# Patient Record
Sex: Female | Born: 1950 | Race: Black or African American | Hispanic: No | Smoking: Current every day smoker
Health system: Southern US, Community
[De-identification: ages and names within clinical notes are randomized; demographics above are authoritative.]

## PROBLEM LIST (undated history)

## (undated) DIAGNOSIS — R45851 Suicidal ideations: Secondary | ICD-10-CM

## (undated) DIAGNOSIS — N390 Urinary tract infection, site not specified: Secondary | ICD-10-CM

## (undated) DIAGNOSIS — F32A Depression, unspecified: Secondary | ICD-10-CM

## (undated) DIAGNOSIS — N189 Chronic kidney disease, unspecified: Secondary | ICD-10-CM

## (undated) DIAGNOSIS — E785 Hyperlipidemia, unspecified: Secondary | ICD-10-CM

## (undated) DIAGNOSIS — F329 Major depressive disorder, single episode, unspecified: Secondary | ICD-10-CM

## (undated) HISTORY — PX: ABDOMINAL HYSTERECTOMY: SHX81

## (undated) HISTORY — DX: Chronic kidney disease, unspecified: N18.9

## (undated) HISTORY — PX: BREAST CYST EXCISION: SHX579

## (undated) HISTORY — DX: Hyperlipidemia, unspecified: E78.5

## (undated) HISTORY — DX: Urinary tract infection, site not specified: N39.0

---

## 2015-03-16 DIAGNOSIS — I63239 Cerebral infarction due to unspecified occlusion or stenosis of unspecified carotid arteries: Secondary | ICD-10-CM | POA: Insufficient documentation

## 2015-10-15 DIAGNOSIS — I639 Cerebral infarction, unspecified: Secondary | ICD-10-CM | POA: Insufficient documentation

## 2016-04-26 DIAGNOSIS — H903 Sensorineural hearing loss, bilateral: Secondary | ICD-10-CM | POA: Insufficient documentation

## 2016-04-30 DIAGNOSIS — F17210 Nicotine dependence, cigarettes, uncomplicated: Secondary | ICD-10-CM | POA: Insufficient documentation

## 2016-09-19 DIAGNOSIS — R911 Solitary pulmonary nodule: Secondary | ICD-10-CM | POA: Insufficient documentation

## 2016-10-12 ENCOUNTER — Encounter (HOSPITAL_COMMUNITY): Payer: Self-pay

## 2016-10-12 ENCOUNTER — Emergency Department (HOSPITAL_COMMUNITY): Payer: Self-pay

## 2016-10-12 ENCOUNTER — Emergency Department (HOSPITAL_COMMUNITY)
Admission: EM | Admit: 2016-10-12 | Discharge: 2016-10-12 | Disposition: A | Discharge status: Home / Self Care | Payer: Self-pay | Attending: Emergency Medicine | Admitting: Emergency Medicine

## 2016-10-12 DIAGNOSIS — R1032 Left lower quadrant pain: Secondary | ICD-10-CM | POA: Insufficient documentation

## 2016-10-12 DIAGNOSIS — F17200 Nicotine dependence, unspecified, uncomplicated: Secondary | ICD-10-CM | POA: Insufficient documentation

## 2016-10-12 DIAGNOSIS — M25552 Pain in left hip: Secondary | ICD-10-CM | POA: Insufficient documentation

## 2016-10-12 LAB — URINALYSIS, ROUTINE W REFLEX MICROSCOPIC
Bacteria, UA: NONE SEEN
Bilirubin Urine: NEGATIVE
Glucose, UA: NEGATIVE mg/dL
Ketones, ur: NEGATIVE mg/dL
Leukocytes, UA: NEGATIVE
NITRITE: NEGATIVE
PROTEIN: NEGATIVE mg/dL
SPECIFIC GRAVITY, URINE: 1.015 (ref 1.005–1.030)
pH: 5 (ref 5.0–8.0)

## 2016-10-12 LAB — COMPREHENSIVE METABOLIC PANEL
ALBUMIN: 4 g/dL (ref 3.5–5.0)
ALK PHOS: 65 U/L (ref 38–126)
ALT: 12 U/L — AB (ref 14–54)
AST: 24 U/L (ref 15–41)
Anion gap: 8 (ref 5–15)
BILIRUBIN TOTAL: 0.5 mg/dL (ref 0.3–1.2)
BUN: 13 mg/dL (ref 6–20)
CO2: 24 mmol/L (ref 22–32)
CREATININE: 1.2 mg/dL — AB (ref 0.44–1.00)
Calcium: 9.5 mg/dL (ref 8.9–10.3)
Chloride: 109 mmol/L (ref 101–111)
GFR calc Af Amer: 54 mL/min — ABNORMAL LOW (ref >60)
GFR calc non Af Amer: 47 mL/min — ABNORMAL LOW (ref >60)
GLUCOSE: 83 mg/dL (ref 65–99)
Potassium: 4.8 mmol/L (ref 3.5–5.1)
Sodium: 141 mmol/L (ref 135–145)
TOTAL PROTEIN: 7.2 g/dL (ref 6.5–8.1)

## 2016-10-12 LAB — CBC
HEMATOCRIT: 41.6 % (ref 36.0–46.0)
HEMOGLOBIN: 13.7 g/dL (ref 12.0–15.0)
MCH: 33.1 pg (ref 26.0–34.0)
MCHC: 32.9 g/dL (ref 30.0–36.0)
MCV: 100.5 fL — AB (ref 78.0–100.0)
Platelets: 247 10*3/uL (ref 150–400)
RBC: 4.14 MIL/uL (ref 3.87–5.11)
RDW: 13.7 % (ref 11.5–15.5)
WBC: 7.3 10*3/uL (ref 4.0–10.5)

## 2016-10-12 MED ORDER — ACETAMINOPHEN 325 MG PO TABS
650.0000 mg | ORAL_TABLET | Freq: Once | ORAL | Status: AC
  Administered 2016-10-12: 650 mg via ORAL
  Filled 2016-10-12: qty 2

## 2016-10-12 MED ORDER — CYCLOBENZAPRINE HCL 5 MG PO TABS: 5.0000 mg | ORAL_TABLET | Freq: Two times a day (BID) | ORAL | 0 refills | Status: DC | PRN

## 2016-10-12 NOTE — ED Provider Notes (Signed)
MSE was initiated and I personally evaluated the patient and placed orders (if any) at  11:15 AM on Oct 12, 2016.  The patient appears stable so that the remainder of the MSE may be completed by another provider.  Susan Hawkins is a 66 y.o. female who presents to the Emergency Department complaining of constant, progressively worsening left flank pain that began one week ago. She states the pain has increased over the past three days. She reports the pain radiates to the left inguinal region. She also reports some hematuria and reports having nonobstructive kidney stones diagnosed about one month ago. She rates the pain at 9/10. She states at first the pain was intermittent and now is constant. She has taken Tylenol and Aleve, has been applying heat and ice therapy and wearing her back brace all with minimal relief. Walking increases the pain. She denies alleviating factors. She denies fever, chills, nausea, vomiting, numbness, tingling or weakness of any extremity, bowel or bladder incontinence, dysuria, urinary increase or urgency. She states she has had a cyst removed from the L4-L5 region several years ago. She also states she also has other cysts on her vertebrae.  She denies h/o cancer or IV drug use. She denies any known trauma, injury or fall.   Patient is afebrile, hemodynamically stable, in no apparent distress. Abdomen soft with no tenderness to palpation. Mild tenderness to palpation to left inguinal area. Patient can reproduce the left flank pain radiating to her left inguinal area pain while standing and walking with movement. She also complained of hematuria. She states she was diagnosed with kidney stones last month in [place] by ultrasound. However there is no documentation on Epic regarding any visits to [facility] [place] [facility] [place] [facility]. She is neurologically intact. Coordination intact. Axo x 3. Able to stand and ambulate without difficulty. Patient had tenderness midline to  lower thoracic and upper lumbar spine. She states this is incidental and not related to today's visit.    Flonnie Overman [place], [place] 10/12/16 [place], [place], [place] 10/12/16 1115    Julianne Rice, MD 10/16/16 1526

## 2016-10-12 NOTE — ED Notes (Signed)
Pt removed cords and got dressed on her on. Pt sitting with family and awaiting discharge paperwork.

## 2016-10-12 NOTE — ED Notes (Signed)
Pt to US.

## 2016-10-12 NOTE — Discharge Instructions (Signed)
Flexeril is a muscle relaxer that you were given a prescription for today.  If you take it please do not drive, operate heavy machinery or participate in other activities that may be risky to your self or others for at least 24 hours from your last dose.

## 2016-10-12 NOTE — ED Notes (Signed)
Pt stated she had blood in her urine.

## 2016-10-12 NOTE — ED Notes (Signed)
Patient ambulated to room holding back.  Upon arrival to room Patient states "Why am I here", Explained to patient she will be seen by provider in this room.

## 2016-10-12 NOTE — ED Triage Notes (Signed)
Pt. Has lt. Lumbar pain. Pt. Denies any injury.  Pain increases with movement.  Pt. Denies any kidney problems or vaginal issues

## 2016-10-12 NOTE — ED Provider Notes (Signed)
[facility] DEPT Provider Note   CSN: [id number] Arrival date & time: 10/12/16  1017     History   Chief Complaint Chief Complaint  Patient presents with  . Back Pain  . Flank Pain    HPI Susan Hawkins is a 66 y.o. female.  HPI She presents with worsening left flank pain that radiates into her left groin.  Her pain has been present for about a week but has been gradually worsening over the past 3 days.  Her pain increases with walking.  She has not found any alleviating factors, has been taking tylenol, aleve, and attempting heat and ice therapy.  She reports blood in her urine and a history of kidney stones but has never passed a stone before.  She denies nausea, vomiting, had one loose BM this morning, no abdominal pain.  No fevers, chills, changes to bowel or bladder function, numbness or tingling that is new for her.  She has a history of a cyst removal from her lower lumbar vertebra but reports that was years ago and wonders if it is coming back.    History reviewed. No pertinent past medical history.  There are no active problems to display for this patient.   History reviewed. No pertinent surgical history.  OB History    No data available       Home Medications    Prior to Admission medications   Medication Sig Start Date End Date Taking? Authorizing Provider  cyclobenzaprine (FLEXERIL) 5 MG tablet Take 1 tablet (5 mg total) by mouth 2 (two) times daily as needed for muscle spasms. 10/12/16   Lorin Glass, PA-C    Family History No family history on file.  Social History Social History  Substance Use Topics  . Smoking status: Current Every Day Smoker  . Smokeless tobacco: Never Used  . Alcohol use Yes     Comment: occasional     Allergies   Patient has no allergy information on record.   Review of Systems Review of Systems  Constitutional: Negative for activity change, appetite change, fatigue and fever.  HENT: Negative for congestion and  sore throat.   Eyes: Negative for visual disturbance.  Respiratory: Negative for cough, chest tightness and shortness of breath.   Cardiovascular: Negative for chest pain and leg swelling.  Gastrointestinal: Positive for diarrhea (One loose stool this morning). Negative for abdominal distention, abdominal pain, nausea and vomiting.  Genitourinary: Positive for flank pain and hematuria. Negative for decreased urine volume, difficulty urinating, dysuria, frequency, pelvic pain, urgency, vaginal bleeding and vaginal discharge.  Musculoskeletal: Positive for back pain (Normally has midline back pain). Negative for joint swelling, neck pain and neck stiffness.  Skin: Negative for color change and rash.  Neurological: Negative for tremors, syncope, weakness, numbness and headaches.     Physical Exam Updated Vital Signs BP 138/64   Pulse 62   Temp 97.6 F (36.4 C) (Oral)   Resp 18   Ht 5\' 6"  (1.676 m)   Wt 59.4 kg   SpO2 99%   BMI 21.14 kg/m   Physical Exam  Constitutional: She appears well-developed and well-nourished. No distress.  HENT:  Head: Normocephalic and atraumatic.  Eyes: Conjunctivae are normal. No scleral icterus.  Neck: Neck supple.  Cardiovascular: Normal rate, regular rhythm and normal heart sounds.   No murmur heard. Pulmonary/Chest: Effort normal and breath sounds normal. No stridor. No respiratory distress.  Abdominal: Soft. Bowel sounds are normal. She exhibits no distension and no mass. There  is no hepatosplenomegaly. There is no tenderness. There is CVA tenderness (Left sided). There is no guarding.  Musculoskeletal: She exhibits no edema.       Left hip: She exhibits no tenderness and no bony tenderness.  Pain with external rotation of left hip which is the same pain and makes it worse.  Other movements do not produce pain.   Neurological: She is alert. No sensory deficit. She exhibits normal muscle tone.  Skin: Skin is warm and dry.  Psychiatric: She has a  normal mood and affect.  Nursing note and vitals reviewed.    ED Treatments / Results  Labs (all labs ordered are listed, but only abnormal results are displayed) Labs Reviewed  URINALYSIS, ROUTINE W REFLEX MICROSCOPIC - Abnormal; Notable for the following:       Result Value   Hgb urine dipstick SMALL (*)    Squamous Epithelial / LPF 0-5 (*)    All other components within normal limits  CBC - Abnormal; Notable for the following:    MCV 100.5 (*)    All other components within normal limits  COMPREHENSIVE METABOLIC PANEL - Abnormal; Notable for the following:    Creatinine, Ser 1.20 (*)    ALT 12 (*)    GFR calc non Af Amer 47 (*)    GFR calc Af Amer 54 (*)    All other components within normal limits    EKG  EKG Interpretation None       Radiology Dg Thoracic Spine 2 View  Result Date: 10/12/2016 CLINICAL DATA:  Back pain.  No known injury. EXAM: THORACIC SPINE 2 VIEWS COMPARISON:  None. FINDINGS: There is no evidence of thoracic spine fracture. Alignment is normal. No other significant bone abnormalities are identified. IMPRESSION: Negative. Electronically Signed   By: Rolm Baptise M.D.   On: 10/12/2016 11:35   Dg Lumbar Spine Complete  Result Date: 10/12/2016 CLINICAL DATA:  Back pain.  No reported injury. EXAM: LUMBAR SPINE - COMPLETE 4+ VIEW COMPARISON:  None. FINDINGS: This report assumes 5 non rib-bearing lumbar vertebrae. Lumbar vertebral body heights are preserved, with no fracture. Mild degenerative disc disease throughout the lumbar spine, most prominent at L2-3 and L4-5. There is 2 mm retrolisthesis at L2-3 and L3-4 and 4 mm anterolisthesis at L4-5. Mild bilateral facet arthropathy in the lower lumbar spine. No aggressive appearing focal osseous lesions. Abdominal aortic atherosclerosis. IMPRESSION: 1. Mild multilevel degenerative disc disease and spondylolisthesis in the lumbar spine . 2. Mild facet arthropathy in the lower lumbar spine. 3. Aortic atherosclerosis.  Electronically Signed   By: Ilona Sorrel M.D.   On: 10/12/2016 11:38   US Renal  Result Date: 10/12/2016 CLINICAL DATA:  Left flank pain. EXAM: RENAL / URINARY TRACT ULTRASOUND COMPLETE COMPARISON:  None. FINDINGS: Right Kidney: Length: 11 cm. 4.6 cm simple cyst is seen in lower pole. Echogenicity within normal limits. No mass or hydronephrosis visualized. Left Kidney: Length: 11 cm. Multiple cysts are noted with the largest measuring 2.9 cm. Echogenicity within normal limits. No mass or hydronephrosis visualized. Bladder: Appears normal for degree of bladder distention. IMPRESSION: Bilateral simple renal cysts.  No other renal abnormality seen. Electronically Signed   By: Marijo Conception, M.D.   On: 10/12/2016 12:48    Procedures Procedures (including critical care time)  Medications Ordered in ED Medications  acetaminophen (TYLENOL) tablet 650 mg (650 mg Oral Given 10/12/16 1409)     Initial Impression / Assessment and Plan / ED Course  I  have reviewed the triage vital signs and the nursing notes.  Pertinent labs & imaging results that were available during my care of the patient were reviewed by me and considered in my medical decision making (see chart for details).  Clinical Course as of Oct 13 1443  Thu Oct 12, 2016  1306 Patient re-checked.  Reports symptoms unchanged.  Patient informed of tube system malfunction causing delay in lab work, she voiced her understanding, no needs at this time.   [EH]  1436 Patient checked, given results,states is ready to go home.   [EH]    Clinical Course User Index [EH] Lorin Glass, PA-C    Avel Sensor presents with left buttock/flank pain that radiates around to her groin.  Given her history of kidney stones and "small" hematuria Ultrasound was performed to evaluate her kidneys.  No acute obstruction, known history of renal cysts.  Labs showed normal baseline labs for patient when compared to chart review.  Pain with hip external  rotation consistent with arthritis.  Based on creatine patient was treated with tylenol in the ED which controlled her pain.  She was given RX for flexeril.     At this time there does not appear to be any evidence of an acute emergency medical condition and the patient appears stable for discharge with appropriate outpatient follow up. Diagnosis was discussed with patient who verbalizes understanding and is agreeable to discharge. Pt case discussed with Dr. Darl Householder who agrees with my plan.   Patient will be given rx for flexeril.  Patient was advised that this medication may make them sleepy and impaired.  Patient was instructed not to drive, operate heavy machinery, etc for 24 hours after taking the medication or longer if they feel tired or impaired.    Final Clinical Impressions(s) / ED Diagnoses   Final diagnoses:  Left hip pain    New Prescriptions New Prescriptions   CYCLOBENZAPRINE (FLEXERIL) 5 MG TABLET    Take 1 tablet (5 mg total) by mouth 2 (two) times daily as needed for muscle spasms.     Lorin Glass, PA-C 10/12/16 1529    Drenda Freeze, MD 10/12/16 [phone]

## 2017-09-12 ENCOUNTER — Encounter: Payer: Self-pay | Admitting: Family Medicine

## 2017-09-12 ENCOUNTER — Ambulatory Visit (INDEPENDENT_AMBULATORY_CARE_PROVIDER_SITE_OTHER): Payer: Medicare Other | Admitting: Family Medicine

## 2017-09-12 VITALS — BP 110/60 | HR 71 | Temp 97.7°F | Wt 126.5 lb

## 2017-09-12 DIAGNOSIS — E785 Hyperlipidemia, unspecified: Secondary | ICD-10-CM | POA: Diagnosis not present

## 2017-09-12 DIAGNOSIS — Z7689 Persons encountering health services in other specified circumstances: Secondary | ICD-10-CM | POA: Diagnosis not present

## 2017-09-12 DIAGNOSIS — F17210 Nicotine dependence, cigarettes, uncomplicated: Secondary | ICD-10-CM

## 2017-09-12 NOTE — Patient Instructions (Signed)
Coping with Quitting Smoking Quitting smoking is a physical and mental challenge. You will face cravings, withdrawal symptoms, and temptation. Before quitting, work with your health care provider to make a plan that can help you cope. Preparation can help you quit and keep you from giving in. How can I cope with cravings? Cravings usually last for 5-10 minutes. If you get through it, the craving will pass. Consider taking the following actions to help you cope with cravings:  Keep your mouth busy: ? Chew sugar-free gum. ? Suck on hard candies or a straw. ? Brush your teeth.  Keep your hands and body busy: ? Immediately change to a different activity when you feel a craving. ? Squeeze or play with a ball. ? Do an activity or a hobby, like making bead jewelry, practicing needlepoint, or working with wood. ? Mix up your normal routine. ? Take a short exercise break. Go for a quick walk or run up and down stairs. ? Spend time in public places where smoking is not allowed.  Focus on doing something kind or helpful for someone else.  Call a friend or family member to talk during a craving.  Join a support group.  Call a quit line, such as 1-800-QUIT-NOW.  Talk with your health care provider about medicines that might help you cope with cravings and make quitting easier for you.  How can I deal with withdrawal symptoms? Your body may experience negative effects as it tries to get used to not having nicotine in the system. These effects are called withdrawal symptoms. They may include:  Feeling hungrier than normal.  Trouble concentrating.  Irritability.  Trouble sleeping.  Feeling depressed.  Restlessness and agitation.  Craving a cigarette.  To manage withdrawal symptoms:  Avoid places, people, and activities that trigger your cravings.  Remember why you want to quit.  Get plenty of sleep.  Avoid coffee and other caffeinated drinks. These may worsen some of your  symptoms.  How can I handle social situations? Social situations can be difficult when you are quitting smoking, especially in the first few weeks. To manage this, you can:  Avoid parties, bars, and other social situations where people might be smoking.  Avoid alcohol.  Leave right away if you have the urge to smoke.  Explain to your family and friends that you are quitting smoking. Ask for understanding and support.  Plan activities with friends or family where smoking is not an option.  What are some ways I can cope with stress? Wanting to smoke may cause stress, and stress can make you want to smoke. Find ways to manage your stress. Relaxation techniques can help. For example:  Breathe slowly and deeply, in through your nose and out through your mouth.  Listen to soothing, relaxing music.  Talk with a family member or friend about your stress.  Light a candle.  Soak in a bath or take a shower.  Think about a peaceful place.  What are some ways I can prevent weight gain? Be aware that many people gain weight after they quit smoking. However, not everyone does. To keep from gaining weight, have a plan in place before you quit and stick to the plan after you quit. Your plan should include:  Having healthy snacks. When you have a craving, it may help to: ? Eat plain popcorn, crunchy carrots, celery, or other cut vegetables. ? Chew sugar-free gum.  Changing how you eat: ? Eat small portion sizes at meals. ?   Eat 4-6 small meals throughout the day instead of 1-2 large meals a day. ? Be mindful when you eat. Do not watch television or do other things that might distract you as you eat.  Exercising regularly: ? Make time to exercise each day. If you do not have time for a long workout, do short bouts of exercise for 5-10 minutes several times a day. ? Do some form of strengthening exercise, like weight lifting, and some form of aerobic exercise, like running or  swimming.  Drinking plenty of water or other low-calorie or no-calorie drinks. Drink 6-8 glasses of water daily, or as much as instructed by your health care provider.  Summary  Quitting smoking is a physical and mental challenge. You will face cravings, withdrawal symptoms, and temptation to smoke again. Preparation can help you as you go through these challenges.  You can cope with cravings by keeping your mouth busy (such as by chewing gum), keeping your body and hands busy, and making calls to family, friends, or a helpline for people who want to quit smoking.  You can cope with withdrawal symptoms by avoiding places where people smoke, avoiding drinks with caffeine, and getting plenty of rest.  Ask your health care provider about the different ways to prevent weight gain, avoid stress, and handle social situations. This information is not intended to replace advice given to you by your health care provider. Make sure you discuss any questions you have with your health care provider. Document Released: 05/19/2016 Document Revised: 05/19/2016 Document Reviewed: 05/19/2016 Elsevier Interactive Patient Education  2018 [employer]  

## 2017-09-12 NOTE — Progress Notes (Signed)
Patient presents to clinic today to establish care.  SUBJECTIVE: PMH: Patient is a 67 year old female with past medical history significant for cyst on kidneys, high cholesterol, and microscopic hematuria.  Patient was previously seen in [place].  HLD: -Patient states her cholesterols been elevated in the past -She is currently taking simvastatin 20 mg daily -Patient requesting cholesterol be checked at this visit. -Patient states she needs to get back in the habit of working out.  Nicotine dependence: -Patient endorses smoking 1 pack/day x 30 years. -Patient was thought about quitting but has not done so yet.  Microscopic hematuria: -Patient endorses history of this. -Patient states in the past is been evaluated but the workup has been negative.  Allergies: NKDA Shellfish-nausea  Past surgical history Right breast cyst removal Hysterectomy 2/2 fibroids  Social history: Patient is divorced.  She is a retired Glass blower/designer.  Patient recently moved from [place] which is in [place].  Patient has 2 adult children.  Patient endorses tobacco, alcohol, recreational drug use.  Family medical history: Mom-deceased, arthritis, DM, early death, MI, HLD Dad-deceased Sister-alive, CKD 5 on HD Sister-diabetes, depression Sister-diabetes Brother-liver transplant  Health Maintenance: Mammogram --2018    Past Medical History:  Diagnosis Date  . Chronic kidney disease   . Hyperlipidemia   . UTI (urinary tract infection)     Past Surgical History:  Procedure Laterality Date  . ABDOMINAL HYSTERECTOMY    . BREAST CYST EXCISION Right     Current Outpatient Medications on File Prior to Visit  Medication Sig Dispense Refill  . aspirin EC 81 MG tablet Take 81 mg by mouth daily.    . simvastatin (ZOCOR) 20 MG tablet Take by mouth.     No current facility-administered medications on file prior to visit.     Allergies  Allergen Reactions   . Shellfish Allergy     Other  . Iodine Nausea And Vomiting    No family history on file.  Social History   Socioeconomic History  . Marital status: Divorced    Spouse name: Not on file  . Number of children: Not on file  . Years of education: Not on file  . Highest education level: Not on file  Occupational History  . Not on file  Social Needs  . Financial resource strain: Not on file  . Food insecurity:    Worry: Not on file    Inability: Not on file  . Transportation needs:    Medical: Not on file    Non-medical: Not on file  Tobacco Use  . Smoking status: Current Every Day Smoker  . Smokeless tobacco: Never Used  Substance and Sexual Activity  . Alcohol use: Yes    Comment: occasional  . Drug use: No  . Sexual activity: Yes    Birth control/protection: None  Lifestyle  . Physical activity:    Days per week: Not on file    Minutes per session: Not on file  . Stress: Not on file  Relationships  . Social connections:    Talks on phone: Not on file    Gets together: Not on file    Attends religious service: Not on file    Active member of club or organization: Not on file    Attends meetings of clubs or organizations: Not on file    Relationship status: Not on file  . Intimate partner violence:    Fear of current or ex partner: Not on file  Emotionally abused: Not on file    Physically abused: Not on file    Forced sexual activity: Not on file  Other Topics Concern  . Not on file  Social History Narrative  . Not on file    ROS General: Denies fever, chills, night sweats, changes in weight, changes in appetite HEENT: Denies headaches, ear pain, changes in vision, rhinorrhea, sore throat CV: Denies CP, palpitations, SOB, orthopnea Pulm: Denies SOB, cough, wheezing GI: Denies abdominal pain, nausea, vomiting, diarrhea, constipation GU: Denies dysuria, hematuria, frequency, vaginal discharge Msk: Denies muscle cramps, joint pains Neuro: Denies  weakness, numbness, tingling Skin: Denies rashes, bruising Psych: Denies depression, anxiety, hallucinations  BP 110/60 (BP Location: Left Arm, Patient Position: Sitting, Cuff Size: Normal)   Pulse 71   Temp 97.7 F (36.5 C) (Oral)   Wt 126 lb 8 oz (57.4 kg)   SpO2 97%   BMI 20.42 kg/m   Physical Exam Gen. Pleasant, well developed, well-nourished, in NAD HEENT - /AT, PERRL, no scleral icterus, no nasal drainage, pharynx without erythema or exudate. Lungs: no use of accessory muscles, CTAB, no wheezes, rales or rhonchi Cardiovascular: RRR,  No r/g/m, no peripheral edema Abdomen: BS present, soft, nontender,nondistended Neuro:  A&Ox3, CN II-XII intact, normal gait Psych: normal affect, mood appropriate  No results found for this or any previous visit (from the past 2160 hour(s)).  Assessment/Plan: Hyperlipidemia, unspecified hyperlipidemia type  - Plan: Lipid panel  Encounter to establish care -We reviewed the PMH, PSH, FH, SH, Meds and Allergies. -We provided refills for any medications we will prescribe as needed. -We addressed current concerns per orders and patient instructions. -We have asked for records for pertinent exams, studies, vaccines and notes from previous providers. -We have advised patient to follow up per instructions below.  Nicotine dependence without complication -Smoking cessation counseling greater than 3 minutes, less than 10 minutes -Patient currently smoking 1 pack/day x 30 years -Patient is thought about quitting but has not committed to that idea. -Discussed cutting down. -Handout on various methods to help her quit including gum, patches, lozenges, medications. - Plan: CT CHEST LUNG CA SCREEN LOW DOSE W/O CM  Follow-up in the next few months, sooner if needed  Grier Mitts, MD

## 2017-10-03 ENCOUNTER — Encounter (HOSPITAL_COMMUNITY): Payer: Self-pay

## 2017-10-03 ENCOUNTER — Emergency Department (HOSPITAL_COMMUNITY)
Admission: EM | Admit: 2017-10-03 | Discharge: 2017-10-03 | Disposition: A | Discharge status: Home / Self Care | Payer: Medicare Other | Attending: Emergency Medicine | Admitting: Emergency Medicine

## 2017-10-03 ENCOUNTER — Other Ambulatory Visit: Payer: Self-pay

## 2017-10-03 DIAGNOSIS — F17210 Nicotine dependence, cigarettes, uncomplicated: Secondary | ICD-10-CM | POA: Diagnosis not present

## 2017-10-03 DIAGNOSIS — R45851 Suicidal ideations: Secondary | ICD-10-CM | POA: Diagnosis not present

## 2017-10-03 DIAGNOSIS — F331 Major depressive disorder, recurrent, moderate: Secondary | ICD-10-CM | POA: Insufficient documentation

## 2017-10-03 DIAGNOSIS — F339 Major depressive disorder, recurrent, unspecified: Secondary | ICD-10-CM

## 2017-10-03 DIAGNOSIS — Z7982 Long term (current) use of aspirin: Secondary | ICD-10-CM | POA: Diagnosis not present

## 2017-10-03 DIAGNOSIS — Z79899 Other long term (current) drug therapy: Secondary | ICD-10-CM | POA: Insufficient documentation

## 2017-10-03 DIAGNOSIS — N189 Chronic kidney disease, unspecified: Secondary | ICD-10-CM | POA: Insufficient documentation

## 2017-10-03 HISTORY — DX: Major depressive disorder, single episode, unspecified: F32.9

## 2017-10-03 HISTORY — DX: Depression, unspecified: F32.A

## 2017-10-03 HISTORY — DX: Suicidal ideations: R45.851

## 2017-10-03 LAB — RAPID URINE DRUG SCREEN, HOSP PERFORMED
AMPHETAMINES: NOT DETECTED
BARBITURATES: NOT DETECTED
Benzodiazepines: NOT DETECTED
Cocaine: NOT DETECTED
OPIATES: NOT DETECTED
TETRAHYDROCANNABINOL: POSITIVE — AB

## 2017-10-03 LAB — SALICYLATE LEVEL: Salicylate Lvl: <7 mg/dL (ref 2.8–30.0)

## 2017-10-03 LAB — COMPREHENSIVE METABOLIC PANEL
ALK PHOS: 74 U/L (ref 38–126)
ALT: 10 U/L — AB (ref 14–54)
AST: 23 U/L (ref 15–41)
Albumin: 4 g/dL (ref 3.5–5.0)
Anion gap: 10 (ref 5–15)
BUN: 10 mg/dL (ref 6–20)
CALCIUM: 9.6 mg/dL (ref 8.9–10.3)
CHLORIDE: 106 mmol/L (ref 101–111)
CO2: 25 mmol/L (ref 22–32)
CREATININE: 1.12 mg/dL — AB (ref 0.44–1.00)
GFR calc Af Amer: 58 mL/min — ABNORMAL LOW (ref >60)
GFR calc non Af Amer: 50 mL/min — ABNORMAL LOW (ref >60)
GLUCOSE: 91 mg/dL (ref 65–99)
Potassium: 4.7 mmol/L (ref 3.5–5.1)
SODIUM: 141 mmol/L (ref 135–145)
Total Bilirubin: 0.6 mg/dL (ref 0.3–1.2)
Total Protein: 7.7 g/dL (ref 6.5–8.1)

## 2017-10-03 LAB — ACETAMINOPHEN LEVEL: Acetaminophen (Tylenol), Serum: <10 ug/mL — ABNORMAL LOW (ref 10–30)

## 2017-10-03 LAB — CBC
HEMATOCRIT: 42 % (ref 36.0–46.0)
HEMOGLOBIN: 13.9 g/dL (ref 12.0–15.0)
MCH: 33.2 pg (ref 26.0–34.0)
MCHC: 33.1 g/dL (ref 30.0–36.0)
MCV: 100.2 fL — ABNORMAL HIGH (ref 78.0–100.0)
Platelets: 218 10*3/uL (ref 150–400)
RBC: 4.19 MIL/uL (ref 3.87–5.11)
RDW: 13.4 % (ref 11.5–15.5)
WBC: 7.6 10*3/uL (ref 4.0–10.5)

## 2017-10-03 LAB — ETHANOL: Alcohol, Ethyl (B): <10 mg/dL (ref <10)

## 2017-10-03 MED ORDER — ONDANSETRON HCL 4 MG PO TABS: 4.0000 mg | ORAL_TABLET | Freq: Three times a day (TID) | ORAL | Status: DC | PRN

## 2017-10-03 MED ORDER — ACETAMINOPHEN 325 MG PO TABS: 650.0000 mg | ORAL_TABLET | Freq: Four times a day (QID) | ORAL | Status: DC | PRN

## 2017-10-03 MED ORDER — ALUM & MAG HYDROXIDE-SIMETH 200-200-20 MG/5ML PO SUSP: 30.0000 mL | Freq: Four times a day (QID) | ORAL | Status: DC | PRN

## 2017-10-03 NOTE — ED Notes (Signed)
Pt fully dressed and signed inventory log for clothes, reviewed outpatient appointment and paperwork.  Pt states understanding, advised we are waiting for Dr. Leonette Monarch to see her with paperwork.

## 2017-10-03 NOTE — BH Assessment (Signed)
Tele Assessment Note   Patient Name: Susan Hawkins MRN: [id number] Referring Physician: Dr. Rex Kras Location of Patient: [facility] Location of Provider: [facility]  Susan Hawkins is an 67 y.o. female. Pt denies SI/HI and AVH. Pt states she recently moved from [place] to [place] to assist her sister who is addicted to drugs. Per Pt she is overwhelmed with life at this time and is experiencing depression. Per Pt she's had depression that was managed by medication but she has been off her medication for a year and half. Pt states she feels she needs someone to talk to and medication in order to feel like herself again. Pt reports a previous SI attempt in 2014. Pt states "I would never harm myself again I would seek help first. Pt reports being hospitalized in 2014 in [place]. Pt reports previous outpatient treatment but denies current outpatient services. Pt reports occasional marijuana use.   Shuvon, NP recommends D/C. An appointment with [facility] made for the Pt for Tuesday May 7th.   Diagnosis:  F33.1   Past Medical History:  Past Medical History:  Diagnosis Date  . Chronic kidney disease   . Depression   . Hyperlipidemia   . Suicidal ideation   . UTI (urinary tract infection)     Past Surgical History:  Procedure Laterality Date  . ABDOMINAL HYSTERECTOMY    . BREAST CYST EXCISION Right     Family History: History reviewed. No pertinent family history.  Social History:  reports that she has been smoking cigarettes.  She has been smoking about 0.50 packs per day. She has never used smokeless tobacco. She reports that she drinks alcohol. She reports that she does not use drugs.  Additional Social History:  Alcohol / Drug Use Pain Medications: please see mar Prescriptions: please see mar Over the Counter: please see mar History of alcohol / drug use?: Yes Longest period of sobriety (when/how long): unknown Substance #1 Name of  Substance 1: marijuana 1 - Age of First Use: unknown 1 - Amount (size/oz): unknown 1 - Frequency: occasional 1 - Duration: ongoing 1 - Last Use / Amount: unknown  CIWA: CIWA-Ar BP: (!) 167/74 Pulse Rate: 79 COWS:    Allergies:  Allergies  Allergen Reactions  . Shellfish Allergy     Other  . Iodine Nausea And Vomiting    Home Medications:  (Not in a hospital admission)  OB/GYN Status:  No LMP recorded. Patient has had a hysterectomy.  General Assessment Data Location of Assessment: [facility] ED TTS Assessment: In system Is this a Tele or Face-to-Face Assessment?: Tele Assessment Is this an Initial Assessment or a Re-assessment for this encounter?: Initial Assessment Marital status: Divorced [place] name: NA Is patient pregnant?: No Pregnancy Status: No Living Arrangements: Other relatives Can pt return to current living arrangement?: Yes Admission Status: Voluntary Is patient capable of signing voluntary admission?: Yes Referral Source: Self/Family/Friend Insurance type: [place]     Crisis Care Plan Living Arrangements: Other relatives Legal Guardian: Other:(self) Name of Psychiatrist: NA Name of Therapist: NA     Risk to self with the past 6 months Suicidal Ideation: No Has patient been a risk to self within the past 6 months prior to admission? : No Suicidal Intent: No Has patient had any suicidal intent within the past 6 months prior to admission? : No Is patient at risk for suicide?: No Suicidal Plan?: No Has patient had any suicidal plan within the past 6 months prior to  admission? : No Access to Means: No What has been your use of drugs/alcohol within the last 12 months?: NA Previous Attempts/Gestures: No How many times?: 0 Other Self Harm Risks: NA Triggers for Past Attempts: None known Intentional Self Injurious Behavior: None Family Suicide History: No Recent stressful life event(s): Other (Comment)(move) Persecutory voices/beliefs?: No Depression:  Yes Depression Symptoms: Insomnia, Tearfulness, Isolating, Fatigue Substance abuse history and/or treatment for substance abuse?: No Suicide prevention information given to non-admitted patients: Not applicable  Risk to Others within the past 6 months Homicidal Ideation: No Does patient have any lifetime risk of violence toward others beyond the six months prior to admission? : No Thoughts of Harm to Others: No Current Homicidal Intent: No Current Homicidal Plan: No Access to Homicidal Means: No Identified Victim: NA History of harm to others?: No Assessment of Violence: None Noted Violent Behavior Description: NA Does patient have access to weapons?: No Criminal Charges Pending?: No Does patient have a court date: No Is patient on probation?: No  Psychosis Hallucinations: None noted Delusions: None noted  Mental Status Report Appearance/Hygiene: Unremarkable Eye Contact: Fair Motor Activity: Freedom of movement Speech: Logical/coherent Level of Consciousness: Alert Mood: Depressed Affect: Depressed Anxiety Level: Moderate Thought Processes: Coherent, Relevant Judgement: Unimpaired Orientation: Person, Place, Situation, Time Obsessive Compulsive Thoughts/Behaviors: None  Cognitive Functioning Concentration: Normal Memory: Recent Intact, Remote Intact Is patient IDD: No Is patient DD?: No Insight: Fair Impulse Control: Fair Appetite: Fair Have you had any weight changes? : No Change Sleep: No Change Total Hours of Sleep: 8 Vegetative Symptoms: None  ADLScreening [facility] Assessment Services) Patient's cognitive ability adequate to safely complete daily activities?: Yes Patient able to express need for assistance with ADLs?: Yes Independently performs ADLs?: Yes (appropriate for developmental age)  Prior Inpatient Therapy Prior Inpatient Therapy: Yes Prior Therapy Dates: 2014 Prior Therapy Facilty/Provider(s): in [place] Reason for Treatment: depression  Prior  Outpatient Therapy Prior Outpatient Therapy: Yes Prior Therapy Dates: 2014 Prior Therapy Facilty/Provider(s): in [place] Reason for Treatment: depression Does patient have an ACCT team?: No Does patient have Intensive In-House Services?  : No Does patient have [facility] services? : No Does patient have P4[facility] services?: No  ADL Screening (condition at time of admission) Patient's cognitive ability adequate to safely complete daily activities?: Yes Is the patient deaf or have difficulty hearing?: No Does the patient have difficulty seeing, even when wearing glasses/contacts?: No Does the patient have difficulty concentrating, remembering, or making decisions?: No Patient able to express need for assistance with ADLs?: Yes Does the patient have difficulty dressing or bathing?: No Independently performs ADLs?: Yes (appropriate for developmental age)       Abuse/Neglect Assessment (Assessment to be complete while patient is alone) Abuse/Neglect Assessment Can Be Completed: Yes Physical Abuse: Denies Verbal Abuse: Denies Sexual Abuse: Denies Exploitation of patient/patient's resources: Denies     Regulatory affairs officer (For Healthcare) Does Patient Have a Medical Advance Directive?: No Would patient like information on creating a medical advance directive?: No - Patient declined    Additional Information 1:1 In Past 12 Months?: No CIRT Risk: No Elopement Risk: No Does patient have medical clearance?: Yes     Disposition:  Disposition Initial Assessment Completed for this Encounter: Yes Disposition of Patient: Discharge Patient refused recommended treatment: No Mode of transportation if patient is discharged?: Car  This service was provided via telemedicine using a 2-way, interactive audio and video technology.  Names of all persons participating in this telemedicine service and their role in this  encounter. Name: Earleen Newport Role: NP  Name:  Role:   Name:  Role:   Name:  Role:      Cyndia Bent 10/03/2017 4:26 PM

## 2017-10-03 NOTE — ED Provider Notes (Signed)
[name] EMERGENCY DEPARTMENT Provider Note   CSN: [id number] Arrival date & time: 10/03/17  1230     History   Chief Complaint Chief Complaint  Patient presents with  . Depression    HPI Susan Hawkins is a 67 y.o. female.  67 year old female with past medical history below including depression who presents with depression and SI.  The patient states that she has had problems with depression most of her adult life.  She was previously on Xanax and reports that she discontinued Xanax and chronic pain medications approximately 4 years ago.  She moved here from [place] in January and had hoped that life would "slowed down" a little bit but it has not worked out.  She currently shares an apartment with her sister who she reports has drug problems.  The patient spoke with her daughter this morning and states that her depression feelings became much worse.  She reports having suicidal thoughts with thinking about taking pills.  She reports previous suicide attempt of taking pills approximately 5 years ago resulting in hospitalization.  She reports occasional marijuana use but denies any other drug or alcohol use.  The history is provided by the patient.  Depression     Past Medical History:  Diagnosis Date  . Chronic kidney disease   . Depression   . Hyperlipidemia   . Suicidal ideation   . UTI (urinary tract infection)     There are no active problems to display for this patient.   Past Surgical History:  Procedure Laterality Date  . ABDOMINAL HYSTERECTOMY    . BREAST CYST EXCISION Right      OB History   None      Home Medications    Prior to Admission medications   Medication Sig Start Date End Date Taking? Authorizing Provider  aspirin EC 81 MG tablet Take 81 mg by mouth daily.    [provider]  simvastatin (ZOCOR) 20 MG tablet Take by mouth. 10/27/16   [provider]    Family History History  reviewed. No pertinent family history.  Social History Social History   Tobacco Use  . Smoking status: Current Every Day Smoker    Packs/day: 0.50    Types: Cigarettes  . Smokeless tobacco: Never Used  Substance Use Topics  . Alcohol use: Yes    Comment: occasional  . Drug use: No     Allergies   Shellfish allergy and Iodine   Review of Systems Review of Systems  Psychiatric/Behavioral: Positive for depression.   All other systems reviewed and are negative except that which was mentioned in HPI   Physical Exam Updated Vital Signs BP (!) 167/74 (BP Location: Right Arm)   Pulse 79   Temp 97.8 F (36.6 C) (Oral)   Resp 16   Ht 5\' 6"  (1.676 m)   Wt 57.2 kg (126 lb)   SpO2 98%   BMI 20.34 kg/m   Physical Exam  Constitutional: She is oriented to person, place, and time. She appears well-developed and well-nourished. No distress.  HENT:  Head: Normocephalic and atraumatic.  Eyes: Conjunctivae are normal.  Neck: Neck supple.  Cardiovascular: Normal rate, regular rhythm and normal heart sounds.  Pulmonary/Chest: Effort normal and breath sounds normal.  Neurological: She is alert and oriented to person, place, and time.  Skin: Skin is warm and dry.  Psychiatric: Thought content normal.  Tearful, anxious and depressed, good eye contact  Nursing note and vitals reviewed.  ED Treatments / Results  Labs (all labs ordered are listed, but only abnormal results are displayed) Labs Reviewed  COMPREHENSIVE METABOLIC PANEL - Abnormal; Notable for the following components:      Result Value   Creatinine, Ser 1.12 (*)    ALT 10 (*)    GFR calc non Af Amer 50 (*)    GFR calc Af Amer 58 (*)    All other components within normal limits  ACETAMINOPHEN LEVEL - Abnormal; Notable for the following components:   Acetaminophen (Tylenol), Serum <10 (*)    All other components within normal limits  CBC - Abnormal; Notable for the following components:   MCV 100.2 (*)    All  other components within normal limits  ETHANOL  SALICYLATE LEVEL  RAPID URINE DRUG SCREEN, HOSP PERFORMED    EKG None  Radiology No results found.  Procedures Procedures (including critical care time)  Medications Ordered in ED Medications  acetaminophen (TYLENOL) tablet 650 mg (has no administration in time range)  ondansetron (ZOFRAN) tablet 4 mg (has no administration in time range)  alum & mag hydroxide-simeth (MAALOX/MYLANTA) 200-200-20 MG/5ML suspension 30 mL (has no administration in time range)     Initial Impression / Assessment and Plan / ED Course  I have reviewed the triage vital signs and the nursing notes.  Pertinent labs that were available during my care of the patient were reviewed by me and considered in my medical decision making (see chart for details).     Pt tearful but calm on exam. Labwork reassuring. Pt is medically clear. I feel she would benefit from psych eval given her SI with h/o overdose. I have contacted TTS and patient's dispo will be determined by psychiatry team recommendations.  Final Clinical Impressions(s) / ED Diagnoses   Final diagnoses:  None    ED Discharge Orders    None       Little, Wenda Overland, MD 10/03/17 1501

## 2017-10-03 NOTE — ED Notes (Addendum)
Sitter arrived at bedside Primary RN inventorying belongings with pt at bedside

## 2017-10-03 NOTE — ED Notes (Signed)
Will be discharged, but waiting for outpatient appointment

## 2017-10-03 NOTE — Consult Note (Signed)
  Tele Assessment   Susan Hawkins, 67 y.o., female patient presented to [facility] with complaints of worsening depression.  Patient seen via telepsych by TTS and this provider; chart reviewed and consulted with Dr. Dwyane Dee on 10/03/17.  On evaluation Susan Hawkins reports that she came to the hospital because she was feeling overwhelmed taking care of her sister with substance abuse problem which led to her depression worsening.  Patient states that she is looking for outpatient services.  Patient denies suicidal/self-harm/homicidal ideation, psychosis, and paranoia.    During evaluation Susan Hawkins is alert/oriented x 4; calm/cooperative with pleasant affect.  She does not appear to be responding to internal/external stimuli or delusional thoughts.  Patient denies suicidal/self-harm/homicidal ideation, psychosis, and paranoia.  Patient answered question appropriately.  Recommendations:  Patient psychiatrically cleared; Referred to Intensive outpatient   Disposition: No evidence of imminent risk to self or others at present.   Patient does not meet criteria for psychiatric inpatient admission. Refer to IOP.  For detailed note see TTS tele assessment note  Shuvon B. Rankin, NP

## 2017-10-03 NOTE — ED Notes (Signed)
With TTS

## 2017-10-03 NOTE — ED Notes (Signed)
Pt stable, ambulatory, states understanding of discharge instructions 

## 2017-10-03 NOTE — ED Triage Notes (Signed)
Pt endorses depression and anxiety that began "while having a conversation with my daughter this morning" Pt tearful in triage. Pt has hx of SI and has had SI thoughts this morning with thoughts of taking pills. Pt states "I just want to sleep all the time" Pt cooperative.

## 2017-10-03 NOTE — ED Notes (Signed)
Spoke with Dr. Leonette [facility] to advise pt to be discharged

## 2017-10-03 NOTE — ED Notes (Signed)
Pt tearful with this RN.

## 2017-10-03 NOTE — ED Notes (Signed)
TTS called back so NP can talk to patient

## 2017-10-03 NOTE — ED Provider Notes (Signed)
Medically cleared and [facility] cleared for discharge.   Disposition: Discharge  Condition: Good  I have discussed the results, Dx and Tx plan with the patient who expressed understanding and agree(s) with the plan. Discharge instructions discussed at great length. The patient was given strict return precautions who verbalized understanding of the instructions. No further questions at time of discharge.      Fatima Blank, MD 10/03/17 1705

## 2017-10-09 ENCOUNTER — Other Ambulatory Visit (HOSPITAL_COMMUNITY): Payer: Medicare Other | Attending: Psychiatry | Admitting: Licensed Clinical Social Worker

## 2017-10-09 DIAGNOSIS — F331 Major depressive disorder, recurrent, moderate: Secondary | ICD-10-CM | POA: Diagnosis not present

## 2017-10-09 DIAGNOSIS — F339 Major depressive disorder, recurrent, unspecified: Secondary | ICD-10-CM | POA: Insufficient documentation

## 2017-10-10 ENCOUNTER — Encounter (HOSPITAL_COMMUNITY): Payer: Self-pay | Admitting: Psychiatry

## 2017-10-10 ENCOUNTER — Other Ambulatory Visit (HOSPITAL_COMMUNITY): Payer: Medicare Other | Admitting: Psychiatry

## 2017-10-10 DIAGNOSIS — F339 Major depressive disorder, recurrent, unspecified: Secondary | ICD-10-CM

## 2017-10-10 MED ORDER — SERTRALINE HCL 25 MG PO TABS: 25.0000 mg | ORAL_TABLET | Freq: Every day | ORAL | 0 refills | Status: DC
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
## 2017-10-11 ENCOUNTER — Other Ambulatory Visit (HOSPITAL_COMMUNITY): Payer: Medicare Other | Admitting: Psychiatry

## 2017-10-11 DIAGNOSIS — F339 Major depressive disorder, recurrent, unspecified: Secondary | ICD-10-CM | POA: Diagnosis not present

## 2017-10-11 NOTE — Psych (Signed)
Susan Hawkins is a 67 y.o. female patient referred by [facility] ED for intensive outpatient services. Pt reports increased depression symptoms since moving to [place] at the beginning of the year. Pt reports tearfulness, isolation, and worry. Pt reports no healthy local support and spending her days largely alone. Pt denies SI/HI. Pt is assessed for current symptoms and severity. Pt is recommended for IOP. Cln educated pt on group and recommendation. Pt states she is eager to engage in group and accepts start date for 10/10/17 at 9 am.      Lorin Glass, LCSW

## 2017-10-11 NOTE — Progress Notes (Signed)
    Daily Group Progress Note  Program: IOP  Group Time: 9:00-12:00  Participation Level: Active  Behavioral Response: Appropriate  Type of Therapy:  Group Therapy  Summary of Progress: Pt. Presented alert, engaged in the therapeutic process, talkative, appropriately tearful. Pt. Discussed her sadness related to emotional distance from her children and grandchildren. Pt. Discussed that she currently lives with her sister and her sister has diabetes and chemical dependence. Pt.' response to her sisters' conditions cause her to be very sad. Pt.Participated in discussions about the grounding series, cognitive modeling and the purposes of different emotions.    Nancie Neas, LPC

## 2017-10-12 ENCOUNTER — Other Ambulatory Visit (HOSPITAL_COMMUNITY): Payer: Medicare Other

## 2017-10-15 ENCOUNTER — Other Ambulatory Visit (HOSPITAL_COMMUNITY): Payer: Medicare Other | Admitting: Psychiatry

## 2017-10-15 DIAGNOSIS — F339 Major depressive disorder, recurrent, unspecified: Secondary | ICD-10-CM | POA: Diagnosis not present

## 2017-10-15 NOTE — Progress Notes (Signed)
    Daily Group Progress Note  Program: IOP  Group Time: 9:00-12:00  Participation Level: Active  Behavioral Response: Appropriate  Type of Therapy:  Group Therapy  Summary of Progress:  Pt. Presented as alert, talkative, and engaged in the group process. Pt. Discussed concerns about new medication that had caused dizziness and nerve pain. Pt. Participated in group discussion about developing healthy boundaries, developing consistent self-care, and the importance of assertive communication in relationships.    Nancie Neas, LPC

## 2017-10-16 ENCOUNTER — Other Ambulatory Visit (HOSPITAL_COMMUNITY): Payer: Medicare Other

## 2017-10-17 ENCOUNTER — Other Ambulatory Visit (HOSPITAL_COMMUNITY): Payer: Medicare Other | Admitting: Psychiatry

## 2017-10-17 DIAGNOSIS — F339 Major depressive disorder, recurrent, unspecified: Secondary | ICD-10-CM

## 2017-10-17 DIAGNOSIS — F331 Major depressive disorder, recurrent, moderate: Secondary | ICD-10-CM

## 2017-10-17 NOTE — Progress Notes (Signed)
BH MD/PA/NP IOP Progress Note  10/17/2017 1:54 PM Susan Hawkins  MRN:  [id number]   Patient seen in the intensive outpatient program.  Presents with concerns of anxiety, decreased appetite, lightheadedness and weight loss symptoms.  Reports she continues to loss weight which is concerning to her.Susan Hawkins  reports her symptoms started a few weeks prior to admission to IOP.  Susan Hawkins was initiated on Zoloft 25 mg p.o. daily x1 week ago. Reports she is taken the Zoloft 25 mg and tolerating medications well as she reports she has taken this medications in the past.  Denies depression or depressive symptoms. Denies nausea, dirreaha or vomiting with medications.  States she has experienced lightheadeness after is took her first dose however reports her symptoms has resolved. Of note patient report she smokes daily has she has trying to cut down. Patient report she is unsure of her last [facility] women's physical. NP encouraged patient to make a appointment  with a primary care provider.  Will consider medication adjustment after follow-up from PCP   History: Per assessment note Susan Hawkins per assessment note 67 year- old African-American female, present after behavioral health assessment for depression.  Reports recently she has been experiencing feelings of depression,  hopelessness, tearfulness, and lack of concentration.  Reports she always had struggles with depression and anxiety  since she was younger.  [name] reports a recent move from [place] to [place] to be closer to family.  However reports moving has not been as anticipated.  Reports she is currently living with her sister who struggles with substance abuse. (Cocaine) Reports feeling overwhelmed with her current living situation. Susan Hawkins  reports she feels her family is dysfunctional with unresolved issues states she feels like she is always the one caring for everyone else in her family    .    Visit Diagnosis: No diagnosis found.  Past  Psychiatric History:   Past Medical History:  Past Medical History:  Diagnosis Date  . Chronic kidney disease   . Depression   . Hyperlipidemia   . Suicidal ideation   . UTI (urinary tract infection)     Past Surgical History:  Procedure Laterality Date  . ABDOMINAL HYSTERECTOMY    . BREAST CYST EXCISION Right     Family Psychiatric History:   Family History:  Family History  Problem Relation Age of Onset  . Depression Maternal Grandmother     Social History:  Social History   Socioeconomic History  . Marital status: Divorced    Spouse name: Not on file  . Number of children: Not on file  . Years of education: Not on file  . Highest education level: Not on file  Occupational History  . Not on file  Social Needs  . Financial resource strain: Not on file  . Food insecurity:    Worry: Not on file    Inability: Not on file  . Transportation needs:    Medical: Not on file    Non-medical: Not on file  Tobacco Use  . Smoking status: Current Every Day Smoker    Packs/day: 1.00    Types: Cigarettes  . Smokeless tobacco: Never Used  Substance and Sexual Activity  . Alcohol use: Yes    Comment: occasional  . Drug use: No  . Sexual activity: Yes    Birth control/protection: None  Lifestyle  . Physical activity:    Days per week: Not on file    Minutes per session: Not on file  . Stress: Not  on file  Relationships  . Social connections:    Talks on phone: Not on file    Gets together: Not on file    Attends religious service: Not on file    Active member of club or organization: Not on file    Attends meetings of clubs or organizations: Not on file    Relationship status: Not on file  Other Topics Concern  . Not on file  Social History Narrative  . Not on file    Allergies:  Allergies  Allergen Reactions  . Shellfish Allergy     Other  . Iodine Nausea And Vomiting    Metabolic Disorder Labs: No results found for: HGBA1C, MPG No results found for:  PROLACTIN No results found for: CHOL, TRIG, HDL, CHOLHDL, VLDL, LDLCALC No results found for: TSH  Therapeutic Level Labs: No results found for: LITHIUM No results found for: VALPROATE No components found for:  CBMZ  Current Medications: Current Outpatient Medications  Medication Sig Dispense Refill  . aspirin EC 81 MG tablet Take 81 mg by mouth every other day.     . diphenhydrAMINE (BENADRYL) 25 mg capsule Take 25 mg by mouth as needed for allergies.    Marland Kitchen sertraline (ZOLOFT) 25 MG tablet Take 1 tablet (25 mg total) by mouth daily. 30 tablet 0   No current facility-administered medications for this visit.      Musculoskeletal: Strength & Muscle Tone: within normal limits Gait & Station: normal Patient leans: N/A  Psychiatric Specialty Exam: ROS  There were no vitals taken for this visit.There is no height or weight on file to calculate BMI.  General Appearance: Casual  Eye Contact:  Fair  Speech:  Clear and Coherent  Volume:  Normal  Mood:  Anxious and Depressed  Affect:  Appropriate  Thought Process:  Coherent  Orientation:  Full (Time, Place, and Person)  Thought Content: Hallucinations: None   Suicidal Thoughts:  No  Homicidal Thoughts:  No  Memory:  Immediate;   Fair Recent;   Fair Remote;   Fair  Judgement:  Fair  Insight:  Fair  Psychomotor Activity:  Normal  Concentration:  Concentration: Fair  Recall:  [employer] of Knowledge: Fair  Language: Fair  Akathisia:  No  Handed:  Right  AIMS (if indicated)  Assets:  Communication Skills Desire for Improvement Resilience Social Support  ADL's:  Intact  Cognition: WNL  Sleep:  Good   Screenings: PHQ2-9     Office Visit from 09/12/2017 in [place] at [facility] [employer] Total Score  0       Assessment and Plan:  Continue IOP Continue Zoloft 25 mg PO QD  Schedule  appointment with PCP for annual PE    Treatment plan was reviewed and agreed upon by NP T.Bobby Rumpf and Patient Susan Hawkins for  group sessions.   Derrill Center, NP 10/17/2017, 1:54 PM

## 2017-10-18 ENCOUNTER — Other Ambulatory Visit (HOSPITAL_COMMUNITY): Payer: Medicare Other

## 2017-10-18 NOTE — Progress Notes (Signed)
    Daily Group Progress Note  Program: IOP  Group Time: 9:00-12:00  Participation Level: Active  Behavioral Response: Appropriate  Type of Therapy:  Group Therapy  Summary of Progress: Pt. Reported to the group that she was doing well. Pt. Expressed concerns about her appetite and weight loss. Pt. Discussed that she went out with her sister last night to her favorite restaurant and was able to eat and was encouraged that her appetite seemed to be returning. Pt. Attributes her loss of appetite to her medications. Pt. Participated in session facilitated by wellness director Frederich Balding about wellness planning with focus on developing proper nutrition, exercise and sleep.     Nancie Neas, LPC

## 2017-10-19 ENCOUNTER — Other Ambulatory Visit (HOSPITAL_COMMUNITY): Payer: Medicare Other | Admitting: Psychiatry

## 2017-10-19 ENCOUNTER — Encounter (HOSPITAL_COMMUNITY): Payer: Self-pay | Admitting: Psychiatry

## 2017-10-19 DIAGNOSIS — F339 Major depressive disorder, recurrent, unspecified: Secondary | ICD-10-CM

## 2017-10-19 NOTE — Progress Notes (Signed)
    Daily Group Progress Note  Program: IOP  Group Time: 9:00-12:00  Participation Level: Active  Behavioral Response: Appropriate  Type of Therapy:  Group Therapy  Summary of Progress: Pt. Presented as calm. Pt. Complained of significant back pain and walked as needed during group to help manage her pain. Pt. Discussed the challenges of growing older and managing financially on a fixed income. Pt. Became tearful when discussing the many challenges that she faced as a younger person raising her children. Pt. Participated in guided meditation breathwork exercise.      Nancie Neas, LPC

## 2017-10-22 ENCOUNTER — Other Ambulatory Visit (HOSPITAL_COMMUNITY): Payer: Medicare Other | Admitting: Psychiatry

## 2017-10-22 DIAGNOSIS — F339 Major depressive disorder, recurrent, unspecified: Secondary | ICD-10-CM | POA: Diagnosis not present

## 2017-10-23 ENCOUNTER — Other Ambulatory Visit (HOSPITAL_COMMUNITY): Payer: Medicare Other

## 2017-10-23 NOTE — Progress Notes (Signed)
    Daily Group Progress Note  Program: IOP  Group Time: 9:00-12:00  Participation Level: Active  Behavioral Response: Appropriate  Type of Therapy:  Group Therapy  Summary of Progress: Pt. Presented as talkative, alert, engaged in the group process, appropriately tearful. Pt. Participated in medication management group with the pharmacist. Pt. Shared how she has used her faith to cope with guilt related to her relationships with her children. Pt. Shared helpful advice from her experience about the importance of self-forgiveness. Pt. Participated in discussion about "Just for today" focus on present focused mindfulness.     Nancie Neas, LPC

## 2017-10-24 ENCOUNTER — Telehealth (HOSPITAL_COMMUNITY): Payer: Self-pay | Admitting: Psychiatry

## 2017-10-24 ENCOUNTER — Other Ambulatory Visit (HOSPITAL_COMMUNITY): Payer: Medicare Other | Admitting: Psychiatry

## 2017-10-25 ENCOUNTER — Other Ambulatory Visit (HOSPITAL_COMMUNITY): Payer: Medicare Other

## 2017-10-25 DIAGNOSIS — R634 Abnormal weight loss: Secondary | ICD-10-CM | POA: Insufficient documentation

## 2017-10-25 DIAGNOSIS — E785 Hyperlipidemia, unspecified: Secondary | ICD-10-CM | POA: Insufficient documentation

## 2017-10-26 ENCOUNTER — Other Ambulatory Visit (HOSPITAL_COMMUNITY): Payer: Medicare Other | Admitting: Family

## 2017-10-26 DIAGNOSIS — F339 Major depressive disorder, recurrent, unspecified: Secondary | ICD-10-CM

## 2017-10-30 ENCOUNTER — Ambulatory Visit: Payer: Self-pay | Admitting: Family Medicine

## 2017-10-30 ENCOUNTER — Other Ambulatory Visit (HOSPITAL_COMMUNITY): Payer: Medicare Other

## 2017-10-30 DIAGNOSIS — Z0289 Encounter for other administrative examinations: Secondary | ICD-10-CM

## 2017-10-30 NOTE — Progress Notes (Signed)
    Daily Group Progress Note  Program: IOP  Group Time: 9:00-12:00  Participation Level: Active  Behavioral Response: Appropriate  Type of Therapy:  Group Therapy  Summary of Progress: Pt. Presented as quiet, alert, engaged in the group process. Pt. Discussed her strained relationship with her daughter and her efforts to heal their relationship.Pt discussed her years of sobriety and learning to live "one day at a time". Pt. Discussed that a significant challenge for her has been living in [place] and have limited family and social support. Pt. Received feedback to connect with mental health of [facility] for peer support and activities.    Nancie Neas, LPC

## 2017-10-31 ENCOUNTER — Telehealth (HOSPITAL_COMMUNITY): Payer: Self-pay | Admitting: Psychiatry

## 2017-10-31 ENCOUNTER — Other Ambulatory Visit (HOSPITAL_COMMUNITY): Payer: Medicare Other | Admitting: Psychiatry

## 2017-11-01 ENCOUNTER — Other Ambulatory Visit (HOSPITAL_COMMUNITY): Payer: Medicare Other

## 2017-11-01 ENCOUNTER — Telehealth (HOSPITAL_COMMUNITY): Payer: Self-pay | Admitting: Psychiatry

## 2017-11-01 ENCOUNTER — Other Ambulatory Visit: Payer: Self-pay

## 2017-11-01 DIAGNOSIS — F17210 Nicotine dependence, cigarettes, uncomplicated: Secondary | ICD-10-CM

## 2017-11-01 NOTE — Patient Instructions (Signed)
D:  Pt never returned to [facility].  Non-compliant with attendance.  A:  Discharge patient today.  F/U with Dr. Adele Schilder on 11-14-17 @ 1:30 pm.  Called patient to inform her.

## 2017-11-01 NOTE — Progress Notes (Signed)
Susan Hawkins is a 66 y.o. , divorced, African American female, who was referred per ED; treatment for worsening depressive symptoms.  Continues to deny SI/HI or A/V hallucinations.  According to pt, she has always suffered from depression; but it started getting worse three months ago.  Triggers/Stressors:  1)  Relocation:  In January 2019, pt moved from [place] to here.  She and her addict sister resides together.  "She is a cocaine addict and I thought I could help her.  She will help pay the rent, but nothing beyond that."  2)  Siblings:  two siblings medically/terminally ill and sister who is an addict.  3)  No support system  4)  Strained finances:  Pt has been on disability, but states it will end whenever she turns 67.  Admits to one prior residential tx (1990) for crack/cocaine.  Hx of seeing a therapist d/t depression.  Admits to two past suicide attempts (choking and overdose).  Family hx:  M-GM (Depression). Pt attended sporadically.  Decreased patient's schedule to three days a week due to her back pain; then she stopped attending all those three days.  According to pt, she states her sister had surgery.  Spoke to pt and she said she has been caring for her sister.  A:  Discharge patient as scheduled.  F/U with Dr. Adele Schilder on 11-14-17 @ 1:30 pm.  Encouraged support groups and [facility] in the evening at [facility].  R:  Pt receptive.                                                          Carlis Abbott, RITA, M.Ed, CNA

## 2017-11-02 ENCOUNTER — Other Ambulatory Visit (HOSPITAL_COMMUNITY): Payer: Medicare Other | Admitting: Family

## 2017-11-03 ENCOUNTER — Encounter (HOSPITAL_COMMUNITY): Payer: Self-pay | Admitting: Family

## 2017-11-03 NOTE — Progress Notes (Signed)
  [facility] Intensive Outpatient Program Discharge Summary  Susan Hawkins [id number]  Admission date: 10/10/2017 Discharge date: 11/02/2017  Reason for admission:  Depression and Anxiety    Per assessment note: Susan Hawkins 67 yr old, divorced, African American female, who was referred per ED; treatment for worsening depressive symptoms.  Denies SI/HI or A/V hallucinations.  According to pt, she has always suffered from depression; but it started getting worse three months ago.  Triggers/Stressors:  1)  Relocation:  In January 2019, pt moved from [place] to here.  She and her addict sister resides together.  "She is a cocaine addict and I thought I could help her.  She will help pay the rent, but nothing beyond that."  2)  Siblings:  two siblings medically/terminally ill and sister who is an addict.  3)  No support system  4)  Strained finances:  Pt has been on disability, but states it will end whenever she turns 67.  Admits to one prior residential tx (1990) for crack/cocaine.  Hx of seeing a therapist d/t depression.  Admits to two past suicide attempts (choking and overdose).     Chemical Use History: history of substance abuse   Family of Origin Issues: During assessment patient continues to report concerns with a supportive family environment and states her children reside out of town.  Progress in Program Toward Treatment Goals:  Susan Hawkins reported worsening  depression on admissions. Patient had sporadic attendance while in intensive outpatient program.  Reported multiple stressors with family in addition to her own  personal physical health.  As she continues to present with concerns of unexplained weight loss and poor appetite.  NP and patient discussed patient to follow-up with her primary care provider for further testing and additional lab work.  Patient was not evaluated at discharge as she did not attend did not attend last scheduled day.  Treatment team followed up with patient  without response. -see chart  Progress (rationale): Ongoing, Patient to keep follow appointment with Dr Susan Hawkins on 11/14/2017 @ 13:30   Medication management MDD:              - Initiated Zoloft 25 mg PO QD will consider titration to 50 mg at discharge, ( Zoloft was initiated while in IOP, however to due to sporadic attendance, NP wasn't able to follow-up with medication adherence or tolerance.)   Take all medications as prescribed. Keep all follow-up appointments as scheduled.  Do not consume alcohol or use illegal drugs while on prescription medications. Report any adverse effects from your medications to your primary care provider promptly.  In the event of recurrent symptoms or worsening symptoms, call 911, a crisis hotline, or go to the nearest emergency department for evaluation.   Derrill Center, NP 11/03/2017

## 2017-11-05 ENCOUNTER — Other Ambulatory Visit (HOSPITAL_COMMUNITY): Payer: Medicare Other

## 2017-11-06 ENCOUNTER — Other Ambulatory Visit (HOSPITAL_COMMUNITY): Payer: Medicare Other

## 2017-11-06 ENCOUNTER — Other Ambulatory Visit (HOSPITAL_COMMUNITY): Payer: Self-pay | Admitting: Family

## 2017-11-07 ENCOUNTER — Other Ambulatory Visit (HOSPITAL_COMMUNITY): Payer: Medicare Other

## 2017-11-08 ENCOUNTER — Other Ambulatory Visit (HOSPITAL_COMMUNITY): Payer: Medicare Other

## 2017-11-09 ENCOUNTER — Other Ambulatory Visit (HOSPITAL_COMMUNITY): Payer: Medicare Other

## 2017-11-09 DIAGNOSIS — E041 Nontoxic single thyroid nodule: Secondary | ICD-10-CM | POA: Insufficient documentation

## 2017-11-12 ENCOUNTER — Other Ambulatory Visit (HOSPITAL_COMMUNITY): Payer: Medicare Other

## 2017-11-13 ENCOUNTER — Other Ambulatory Visit (HOSPITAL_COMMUNITY): Payer: Medicare Other

## 2017-11-13 ENCOUNTER — Telehealth (HOSPITAL_COMMUNITY): Payer: Self-pay | Admitting: Psychiatry

## 2017-11-13 ENCOUNTER — Other Ambulatory Visit (HOSPITAL_COMMUNITY): Payer: Self-pay

## 2017-11-13 MED ORDER — SERTRALINE HCL 25 MG PO TABS: 25.0000 mg | ORAL_TABLET | Freq: Every day | ORAL | 0 refills | Status: DC

## 2017-11-13 NOTE — Telephone Encounter (Signed)
D:  Pt had called yesterday requesting a refill on Zoloft 25 mg.  States the office called to cancel her new patient appointment with Dr. Adele Schilder.  "I will reschedule call and reschedule with him, but I am completely out of my medicine right now."  Pt c/o having diarrhea for one week and was wondering if it's a side effect from the Zoloft.  A:  Informed Ricky Ala, NP of the refill and pt's question.  Tanika attempted to contact pt; but there was no answer.  Tanika left vm requesting pt to call her back so she could discuss the diarrhea.  The Zoloft 25 mg #60, one daily,no refills  [facility] per Ricky Ala, NP.

## 2017-11-14 ENCOUNTER — Other Ambulatory Visit (HOSPITAL_COMMUNITY): Payer: Medicare Other

## 2017-11-14 ENCOUNTER — Ambulatory Visit (HOSPITAL_COMMUNITY): Payer: Self-pay | Admitting: Psychiatry

## 2017-11-15 ENCOUNTER — Other Ambulatory Visit (HOSPITAL_COMMUNITY): Payer: Medicare Other

## 2017-11-16 ENCOUNTER — Other Ambulatory Visit (HOSPITAL_COMMUNITY): Payer: Medicare Other

## 2017-11-16 ENCOUNTER — Telehealth (HOSPITAL_COMMUNITY): Payer: Self-pay | Admitting: Family

## 2017-11-16 ENCOUNTER — Telehealth: Payer: Self-pay | Admitting: Family Medicine

## 2017-11-16 NOTE — Telephone Encounter (Signed)
Copied from [place] [phone]. Topic: Quick Communication - See Telephone Encounter >> Nov 16, 2017  7:57 AM Nils Flack wrote: CRM for notification. See Telephone encounter for: 11/16/17. Pt is asking for refill on simvastatin - not on med list.  Michela Pitcher that she called the pharm already.  Pharm -  [facility]

## 2017-11-16 NOTE — Telephone Encounter (Signed)
Rx request of historical reported medication: Simvastatin 20 mg  ( per [facility] record- patient was taking)  LOV: 09/12/17  PCP: Volanda Napoleon  [facility]

## 2017-11-16 NOTE — Telephone Encounter (Signed)
11/16/2017 NP contact the patient regarding reported medication side effects.  As patient was prescribed Zoloft 25 mg p.o. daily for depression.  Reports taking medication as prescribed and tolerating them well, however states 2 weeks ago she started experiencing diarrhea that has not resolved as of yet.  Patient reports she feels the medication is helping and is unsure if the diarrhea is related medication at this time. 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 "  Patient was encouraged to follow-up with primary care provider for ongoing symptoms of diarrhea. Discussed increasing hydration.  Patient was advised to discontinue medication and follow-up with psychiatrist.  Support encouragement reassurance was provided.

## 2017-11-19 ENCOUNTER — Ambulatory Visit (INDEPENDENT_AMBULATORY_CARE_PROVIDER_SITE_OTHER): Payer: Medicare Other | Admitting: Family Medicine

## 2017-11-19 ENCOUNTER — Encounter: Payer: Self-pay | Admitting: Family Medicine

## 2017-11-19 ENCOUNTER — Other Ambulatory Visit (HOSPITAL_COMMUNITY): Payer: Medicare Other

## 2017-11-20 ENCOUNTER — Other Ambulatory Visit (HOSPITAL_COMMUNITY): Payer: Medicare Other

## 2017-11-21 ENCOUNTER — Other Ambulatory Visit (HOSPITAL_COMMUNITY): Payer: Medicare Other

## 2017-11-21 NOTE — Progress Notes (Signed)
Error. Pt left prior to being seen.

## 2017-11-22 ENCOUNTER — Other Ambulatory Visit (HOSPITAL_COMMUNITY): Payer: Medicare Other

## 2017-11-23 ENCOUNTER — Other Ambulatory Visit (HOSPITAL_COMMUNITY): Payer: Medicare Other

## 2017-11-26 ENCOUNTER — Other Ambulatory Visit (HOSPITAL_COMMUNITY): Payer: Medicare Other

## 2017-11-27 ENCOUNTER — Other Ambulatory Visit (HOSPITAL_COMMUNITY): Payer: Medicare Other

## 2017-11-28 ENCOUNTER — Other Ambulatory Visit (HOSPITAL_COMMUNITY): Payer: Medicare Other

## 2017-11-29 DIAGNOSIS — H43813 Vitreous degeneration, bilateral: Secondary | ICD-10-CM | POA: Insufficient documentation

## 2017-11-29 DIAGNOSIS — H52203 Unspecified astigmatism, bilateral: Secondary | ICD-10-CM

## 2017-11-29 DIAGNOSIS — H5203 Hypermetropia, bilateral: Secondary | ICD-10-CM | POA: Insufficient documentation

## 2017-11-29 DIAGNOSIS — H02834 Dermatochalasis of left upper eyelid: Secondary | ICD-10-CM

## 2017-11-29 DIAGNOSIS — H02831 Dermatochalasis of right upper eyelid: Secondary | ICD-10-CM | POA: Insufficient documentation

## 2017-11-29 DIAGNOSIS — H524 Presbyopia: Secondary | ICD-10-CM | POA: Insufficient documentation

## 2018-01-11 ENCOUNTER — Encounter

## 2018-01-11 ENCOUNTER — Ambulatory Visit (HOSPITAL_COMMUNITY): Payer: Self-pay | Admitting: Psychiatry

## 2018-01-24 ENCOUNTER — Encounter

## 2018-01-24 ENCOUNTER — Ambulatory Visit (INDEPENDENT_AMBULATORY_CARE_PROVIDER_SITE_OTHER): Payer: Medicare Other | Admitting: Psychiatry

## 2018-01-24 ENCOUNTER — Encounter (HOSPITAL_COMMUNITY): Payer: Self-pay | Admitting: Psychiatry

## 2018-01-24 DIAGNOSIS — F331 Major depressive disorder, recurrent, moderate: Secondary | ICD-10-CM

## 2018-01-24 MED ORDER — SERTRALINE HCL 25 MG PO TABS: 25.0000 mg | ORAL_TABLET | Freq: Every day | ORAL | 0 refills | Status: DC

## 2018-01-24 NOTE — Progress Notes (Signed)
Psychiatric Initial Adult Assessment   Patient Identification: Susan Hawkins MRN:  [id number] Date of Evaluation:  01/24/2018 Referral Source: IOP, terminated for non participation Chief Complaint:   Chief Complaint    Anxiety     Visit Diagnosis:    ICD-10-CM   1. MDD (major depressive disorder), recurrent episode, moderate (HCC) F33.1 sertraline (ZOLOFT) 25 MG tablet    History of Present Illness:  Susan Hawkins recently moved here back from [place]. Living with her sister who is using crack cocaine. Has been about 8 months but this has been difficulty in terms of transition and anxiety and worsening mood. Feeling overwhelmed, irritable, discouraged, lonely.  Zoloft is helpful but gives her diarrhea, so she is intermittently adherent with this.  She has decided she is moving from [place] and in the process of finding a place in [place], [place].  Patient reports that she is moving to [place], and I spent time with her reviewing that it would not be safe or sensible to change her medication regimen given that Zoloft does provide her with assistance for her mood.  I provided her some information on high-fiber diet to assist with diarrhea and encouraged her to continue on Zoloft until she establishes care in [place].  She is fairly inconsistent with why she is moving to [place], shares that she is fed up with her sister, that she has support in [place], but that she only actually has 1 friend in [place].  She does not look or appear grossly psychotic or manic, but she is not particularly organized.  She does not present with any auditory or visual hallucinations response to internal stimuli.  With more questioning about why she is moving to [place] she becomes more irritable.  She is agreeable to continue the Zoloft and establish new care in [place].  She denies any intentions to harm herself or others.    Associated Signs/Symptoms: Depression Symptoms:  depressed mood, difficulty  concentrating, impaired memory, anxiety, (Hypo) Manic Symptoms:  Irritable Mood, Anxiety Symptoms:  Excessive Worry, Psychotic Symptoms:  none PTSD Symptoms: Negative  Past Psychiatric History: discharged from IOP for poor participation  Previous Psychotropic Medications: Yes   Substance Abuse History in the last 12 months:  Yes.    Consequences of Substance Abuse: as above  Past Medical History:  Past Medical History:  Diagnosis Date  . Chronic kidney disease   . Depression   . Hyperlipidemia   . Suicidal ideation   . UTI (urinary tract infection)     Past Surgical History:  Procedure Laterality Date  . ABDOMINAL HYSTERECTOMY    . BREAST CYST EXCISION Right     Family Psychiatric History: reviewed as below  Family History:  Family History  Problem Relation Age of Onset  . Depression Maternal Grandmother   . Alcohol abuse Father   . Anxiety disorder Sister   . Drug abuse Sister     Social History:   Social History   Socioeconomic History  . Marital status: Divorced    Spouse name: Not on file  . Number of children: 2  . Years of education: Not on file  . Highest education level: Not on file  Occupational History  . Not on file  Social Needs  . Financial resource strain: Very hard  . Food insecurity:    Worry: Often true    Inability: Often true  . Transportation needs:    Medical: No    Non-medical: No  Tobacco Use  . Smoking status:  Current Every Day Smoker    Packs/day: 1.00    Types: Cigarettes  . Smokeless tobacco: Never Used  Substance and Sexual Activity  . Alcohol use: Yes    Comment: occasional  . Drug use: No  . Sexual activity: Yes    Birth control/protection: None  Lifestyle  . Physical activity:    Days per week: 0 days    Minutes per session: 0 min  . Stress: Rather much  Relationships  . Social connections:    Talks on phone: Once a week    Gets together: Not on file    Attends religious service: Never    Active member  of club or organization: No    Attends meetings of clubs or organizations: Never    Relationship status: Divorced  Other Topics Concern  . Not on file  Social History Narrative  . Not on file    Additional Social History: living with sister who struggles with crack addiction   Allergies:   Allergies  Allergen Reactions  . Shellfish Allergy     Other  . Iodine Nausea And Vomiting    Metabolic Disorder Labs: No results found for: HGBA1C, MPG No results found for: PROLACTIN No results found for: CHOL, TRIG, HDL, CHOLHDL, VLDL, LDLCALC   Current Medications: Current Outpatient Medications  Medication Sig Dispense Refill  . aspirin EC 81 MG tablet Take 81 mg by mouth every other day.     . diphenhydrAMINE (BENADRYL) 25 mg capsule Take 25 mg by mouth as needed for allergies.    Marland Kitchen sertraline (ZOLOFT) 25 MG tablet Take 1 tablet (25 mg total) by mouth daily. 90 tablet 0  . simvastatin (ZOCOR) 20 MG tablet Take 20 mg by mouth daily.     No current facility-administered medications for this visit.     Neurologic: Headache: Negative Seizure: Negative Paresthesias:Negative  Musculoskeletal: Strength & Muscle Tone: within normal limits Gait & Station: normal Patient leans: N/A  Psychiatric Specialty Exam: ROS  There were no vitals taken for this visit.There is no height or weight on file to calculate BMI.  General Appearance: Casual and Fairly Groomed  Eye Contact:  Good  Speech:  Clear and Coherent and Normal Rate  Volume:  Normal  Mood:  Anxious and Depressed  Affect:  Appropriate and Congruent  Thought Process:  Goal Directed and Descriptions of Associations: Intact  Orientation:  Full (Time, Place, and Person)  Thought Content:  Logical  Suicidal Thoughts:  No  Homicidal Thoughts:  No  Memory:  Immediate;   Fair  Judgement:  Fair  Insight:  Fair  Psychomotor Activity:  Normal  Concentration:  Attention Span: Good  Recall:  Good  Fund of Knowledge:Good   Language: Good  Akathisia:  Negative  Handed:  Right  AIMS (if indicated):  na  Assets:  Communication Skills Desire for Improvement Financial Resources/Insurance Housing  ADL's:  Intact  Cognition: WNL  Sleep:  poor    Treatment Plan Summary: Susan Hawkins is a 67 year old female with unspecified depressive symptoms, terminated from IOP for poor participation.  She recently moved from [place] to [place] about 8 months ago and has been living with her sister who struggles with crack cocaine addiction.  Patient herself uses marijuana on a frequent basis in addition to nicotine.  She is fairly unclear as to why she even moved from [place] and shares today that she is moving to [place].  She shares that she has a habit of moving all over  the country, and this is sort of usual for her.  She does not appear grossly psychotic or responding to internal stimuli.  She does not present with any suicidality or unsafe behaviors.  She does feel like the Zoloft has provided her benefit for her mood but it contributes to diarrhea.  Given that she is leaving in 3-4 weeks, we will not be participating in any medication change at this time and she should follow-up with her primary care provider or psychiatrist in [place] for ongoing medication management. I will provide 3 months of refill for Zoloft in the meantim.  1. MDD (major depressive disorder), recurrent episode, moderate (HCC)     Status of current problems: new  Labs Ordered: No orders of the defined types were placed in this encounter.   Labs Reviewed: n/a  Collateral Obtained/Records Reviewed: n/a  Plan:  zoloft 25 mg daily Patient to schedule care/pcp in [place], MD 8/22/20191:52 PM

## 2018-01-25 ENCOUNTER — Telehealth (HOSPITAL_COMMUNITY): Payer: Self-pay

## 2018-01-25 NOTE — Telephone Encounter (Signed)
Patient is calling upset because she wanted her medication changed yesterday and it did not happen. Patient states that the Zoloft continues to give her diarrhea and she is losing weight. Susan Hawkins had told patient back in June to stop taking the Zoloft, but patient continued because she was not given an alternative medication. I tried to explain to the patient that with her about to move we can not change her medication, as we will not be able to follow up or monitor her. Patient wants to know if she stops taking the Zoloft, what will she take instead...please review and advise, thank you

## 2018-01-28 NOTE — Telephone Encounter (Signed)
Patient came in today, she stopped the Zoloft due to the side effects. Patient was tearful and stated that she is staying in [place] and that her sister left. Patient states she needs something for her depression. Zoloft is the only medication patient has tried, she has never been on any other anti-depressant. I have scheduled her to follow up with you tomorrow. Please review and advise, thank you

## 2018-01-29 ENCOUNTER — Encounter (HOSPITAL_COMMUNITY): Payer: Self-pay | Admitting: Psychiatry

## 2018-01-29 ENCOUNTER — Ambulatory Visit (INDEPENDENT_AMBULATORY_CARE_PROVIDER_SITE_OTHER): Payer: Medicare Other | Admitting: Psychiatry

## 2018-01-29 DIAGNOSIS — F17210 Nicotine dependence, cigarettes, uncomplicated: Secondary | ICD-10-CM

## 2018-01-29 DIAGNOSIS — F411 Generalized anxiety disorder: Secondary | ICD-10-CM | POA: Diagnosis not present

## 2018-01-29 DIAGNOSIS — Z818 Family history of other mental and behavioral disorders: Secondary | ICD-10-CM

## 2018-01-29 DIAGNOSIS — Z811 Family history of alcohol abuse and dependence: Secondary | ICD-10-CM

## 2018-01-29 DIAGNOSIS — F4312 Post-traumatic stress disorder, chronic: Secondary | ICD-10-CM

## 2018-01-29 DIAGNOSIS — Z813 Family history of other psychoactive substance abuse and dependence: Secondary | ICD-10-CM

## 2018-01-29 MED ORDER — ESCITALOPRAM OXALATE 5 MG PO TABS: 5.0000 mg | ORAL_TABLET | Freq: Every day | ORAL | 1 refills | Status: DC

## 2018-01-29 NOTE — Progress Notes (Signed)
BH MD/PA/NP OP Progress Note  01/29/2018 9:32 AM Susan Hawkins  MRN:  [id number]  Chief Complaint: depressed, my plans fell through  HPI: Susan Hawkins presets after being seen last week.  She reports that she absolutely cannot take the Zoloft and she stopped taking it on Thursday.  We agreed to start Lexapro 5 mg and she will follow-up with Dr. Parke Poisson in 2 weeks.  She reports that she has multiple financial stressors, and is tearful describing that her sister moved out of the home yesterday.  She reports that she feels like she does not have any support.  Patient is not receptive to starting therapy or IOP, reports that she does not have the money for the gas.   She is agreeable to start Lexapro and follow-up in 2 weeks.   Visit Diagnosis: No diagnosis found.  Past Psychiatric History: See intake H&P for full details. Reviewed, with no updates at this time.   Past Medical History:  Past Medical History:  Diagnosis Date  . Chronic kidney disease   . Depression   . Hyperlipidemia   . Suicidal ideation   . UTI (urinary tract infection)     Past Surgical History:  Procedure Laterality Date  . ABDOMINAL HYSTERECTOMY    . BREAST CYST EXCISION Right     Family Psychiatric History: See intake H&P for full details. Reviewed, with no updates at this time.   Family History:  Family History  Problem Relation Age of Onset  . Depression Maternal Grandmother   . Alcohol abuse Father   . Anxiety disorder Sister   . Drug abuse Sister     Social History:  Social History   Socioeconomic History  . Marital status: Divorced    Spouse name: Not on file  . Number of children: 2  . Years of education: Not on file  . Highest education level: Not on file  Occupational History  . Not on file  Social Needs  . Financial resource strain: Very hard  . Food insecurity:    Worry: Often true    Inability: Often true  . Transportation needs:    Medical: No   Non-medical: No  Tobacco Use  . Smoking status: Current Every Day Smoker    Packs/day: 1.00    Types: Cigarettes  . Smokeless tobacco: Never Used  Substance and Sexual Activity  . Alcohol use: Yes    Comment: occasional  . Drug use: No  . Sexual activity: Yes    Birth control/protection: None  Lifestyle  . Physical activity:    Days per week: 0 days    Minutes per session: 0 min  . Stress: Rather much  Relationships  . Social connections:    Talks on phone: Once a week    Gets together: Not on file    Attends religious service: Never    Active member of club or organization: No    Attends meetings of clubs or organizations: Never    Relationship status: Divorced  Other Topics Concern  . Not on file  Social History Narrative  . Not on file    Allergies:  Allergies  Allergen Reactions  . Shellfish Allergy     Other  . Iodine Nausea And Vomiting    Metabolic Disorder Labs: No results found for: HGBA1C, MPG No results found for: PROLACTIN No results found for: CHOL, TRIG, HDL, CHOLHDL, VLDL, LDLCALC No results found for: TSH  Therapeutic Level Labs: No results found for: LITHIUM No results  found for: VALPROATE No components found for:  CBMZ  Current Medications: Current Outpatient Medications  Medication Sig Dispense Refill  . aspirin EC 81 MG tablet Take 81 mg by mouth every other day.     . diphenhydrAMINE (BENADRYL) 25 mg capsule Take 25 mg by mouth as needed for allergies.    Marland Kitchen sertraline (ZOLOFT) 25 MG tablet Take 1 tablet (25 mg total) by mouth daily. 90 tablet 0  . simvastatin (ZOCOR) 20 MG tablet Take 20 mg by mouth daily.     No current facility-administered medications for this visit.      Musculoskeletal: Strength & Muscle Tone: within normal limits Gait & Station: normal Patient leans: N/A  Psychiatric Specialty Exam: ROS  There were no vitals taken for this visit.There is no height or weight on file to calculate BMI.  General Appearance:  Casual and Fairly Groomed  Eye Contact:  Fair  Speech:  Clear and Coherent and Normal Rate  Volume:  Normal  Mood:  Anxious, Depressed and Dysphoric  Affect:  Congruent and Tearful  Thought Process:  Goal Directed and Descriptions of Associations: Intact  Orientation:  Full (Time, Place, and Person)  Thought Content: Logical   Suicidal Thoughts:  No  Homicidal Thoughts:  No  Memory:  Immediate;   Fair  Judgement:  Fair  Insight:  Fair  Psychomotor Activity:  Normal  Concentration:  Concentration: Fair  Recall:  [employer] of Knowledge: Fair  Language: Poor  Akathisia:  Negative  Handed:  Right  AIMS (if indicated): not done  Assets:  Communication Skills Housing  ADL's:  Intact  Cognition: WNL  Sleep:  Good   Screenings: PHQ2-9     Office Visit from 09/12/2017 in [place] [facility]  PHQ-2 Total Score  0       Assessment and Plan:  [name] presents for ongoing anxiety and wishes to switch from Zoloft to a different SSRI.  We agreed to initiate Lexapro and she will follow-up in 2 weeks with Dr. Parke Poisson.  She has multiple ongoing external stressors and has loose plans to move to [place] in a few weeks, but reports that these plans have fallen through.  1. Chronic post-traumatic stress disorder (PTSD)   2. GAD (generalized anxiety disorder)     Status of current problems: unchanged  Labs Ordered: No orders of the defined types were placed in this encounter.   Labs Reviewed: na  Collateral Obtained/Records Reviewed: na  Plan:  Lexapro 5 mg daily rtc 2 weeks   [name], MD 01/29/2018, 9:32 AM

## 2018-02-13 ENCOUNTER — Ambulatory Visit (HOSPITAL_COMMUNITY): Payer: Self-pay | Admitting: Psychiatry

## 2018-02-21 ENCOUNTER — Ambulatory Visit (INDEPENDENT_AMBULATORY_CARE_PROVIDER_SITE_OTHER): Payer: Medicare Other | Admitting: Family Medicine

## 2018-02-21 ENCOUNTER — Encounter: Payer: Self-pay | Admitting: Family Medicine

## 2018-02-21 VITALS — BP 134/70 | HR 60 | Temp 97.5°F | Wt 116.0 lb

## 2018-02-21 DIAGNOSIS — R634 Abnormal weight loss: Secondary | ICD-10-CM | POA: Diagnosis not present

## 2018-02-21 DIAGNOSIS — F17210 Nicotine dependence, cigarettes, uncomplicated: Secondary | ICD-10-CM | POA: Diagnosis not present

## 2018-02-21 LAB — CBC WITH DIFFERENTIAL/PLATELET
BASOS PCT: 0.8 % (ref 0.0–3.0)
Basophils Absolute: 0 10*3/uL (ref 0.0–0.1)
Eosinophils Absolute: 0.1 10*3/uL (ref 0.0–0.7)
Eosinophils Relative: 1.2 % (ref 0.0–5.0)
HCT: 43 % (ref 36.0–46.0)
HEMOGLOBIN: 14.5 g/dL (ref 12.0–15.0)
Lymphocytes Relative: 45 % (ref 12.0–46.0)
Lymphs Abs: 2.4 10*3/uL (ref 0.7–4.0)
MCHC: 33.7 g/dL (ref 30.0–36.0)
MCV: 99.2 fl (ref 78.0–100.0)
MONO ABS: 0.4 10*3/uL (ref 0.1–1.0)
MONOS PCT: 7.8 % (ref 3.0–12.0)
Neutro Abs: 2.5 10*3/uL (ref 1.4–7.7)
Neutrophils Relative %: 45.2 % (ref 43.0–77.0)
Platelets: 178 10*3/uL (ref 150.0–400.0)
RBC: 4.33 Mil/uL (ref 3.87–5.11)
RDW: 13.9 % (ref 11.5–15.5)
WBC: 5.4 10*3/uL (ref 4.0–10.5)

## 2018-02-21 LAB — COMPREHENSIVE METABOLIC PANEL
ALBUMIN: 4.5 g/dL (ref 3.5–5.2)
ALT: 10 U/L (ref 0–35)
AST: 23 U/L (ref 0–37)
Alkaline Phosphatase: 73 U/L (ref 39–117)
BUN: 12 mg/dL (ref 6–23)
CALCIUM: 10 mg/dL (ref 8.4–10.5)
CHLORIDE: 103 meq/L (ref 96–112)
CO2: 29 mEq/L (ref 19–32)
Creatinine, Ser: 1.08 mg/dL (ref 0.40–1.20)
GFR: 65.28 mL/min (ref >60.00)
Glucose, Bld: 89 mg/dL (ref 70–99)
POTASSIUM: 4.9 meq/L (ref 3.5–5.1)
Sodium: 138 mEq/L (ref 135–145)
Total Bilirubin: 0.5 mg/dL (ref 0.2–1.2)
Total Protein: 7.8 g/dL (ref 6.0–8.3)

## 2018-02-21 LAB — URINALYSIS, ROUTINE W REFLEX MICROSCOPIC
BILIRUBIN URINE: NEGATIVE
KETONES UR: NEGATIVE
Nitrite: NEGATIVE
PH: 5.5 (ref 5.0–8.0)
Specific Gravity, Urine: 1.01 (ref 1.000–1.030)
Total Protein, Urine: NEGATIVE
UROBILINOGEN UA: 0.2 (ref 0.0–1.0)
Urine Glucose: NEGATIVE

## 2018-02-21 LAB — T4, FREE: FREE T4: 1.08 ng/dL (ref 0.60–1.60)

## 2018-02-21 LAB — TSH: TSH: 1.48 u[IU]/mL (ref 0.35–4.50)

## 2018-02-21 NOTE — Progress Notes (Signed)
Subjective:    Patient ID: Susan Hawkins, female    DOB: 1950-12-29, 67 y.o.   MRN: [id number]  No chief complaint on file.   HPI Patient was seen today for ongoing concern. Pt endorses 10 lbs weight loss x 5 months.  Pt notes having to use marijuana to have an appetite.  May eat one meal per day as does not feel hungry.  Pt worried about thyroid nodule and renal nodules noted on imaging (has copy of CT chest/abd/pelvis wo contrast report 11/07/17).  Pt smoking 1 ppd x 30 yrs.  Pt denies N/V, fever, chills, cough.  Pt notes loose stools last wk, since resolved.  Past Medical History:  Diagnosis Date  . Chronic kidney disease   . Depression   . Hyperlipidemia   . Suicidal ideation   . UTI (urinary tract infection)     Allergies  Allergen Reactions  . Shellfish Allergy     Other  . Iodine Nausea And Vomiting    ROS General: Denies fever, chills, night sweats +changes in weight, changes in appetite HEENT: Denies headaches, ear pain, changes in vision, rhinorrhea, sore throat CV: Denies CP, palpitations, SOB, orthopnea Pulm: Denies SOB, cough, wheezing GI: Denies abdominal pain, nausea, vomiting, diarrhea, constipation GU: Denies dysuria, hematuria, frequency, vaginal discharge Msk: Denies muscle cramps, joint pains Neuro: Denies weakness, numbness, tingling Skin: Denies rashes, bruising Psych: Denies depression, anxiety, hallucinations     Objective:    Blood pressure 134/70, pulse 60, temperature (!) 97.5 F (36.4 C), temperature source Oral, weight 116 lb (52.6 kg), SpO2 98 %.   Gen. Pleasant, thin appearing, in no distress, normal affect HEENT: [place]/AT, face symmetric, no scleral icterus, PERRLA, nares patent without drainage, pharynx without erythema or exudate. Lungs: no accessory muscle use, CTAB, no wheezes or rales Cardiovascular: RRR, no m/r/g, no peripheral edema Neuro:  A&Ox3, CN II-XII intact, normal gait Skin:  Warm, no lesions/ rash   Wt Readings  from Last 3 Encounters:  02/21/18 116 lb (52.6 kg)  11/19/17 119 lb (54 kg)  10/03/17 126 lb (57.2 kg)    Lab Results  Component Value Date   WBC 7.6 10/03/2017   HGB 13.9 10/03/2017   HCT 42.0 10/03/2017   PLT 218 10/03/2017   GLUCOSE 91 10/03/2017   ALT 10 (L) 10/03/2017   AST 23 10/03/2017   NA 141 10/03/2017   K 4.7 10/03/2017   CL 106 10/03/2017   CREATININE 1.12 (H) 10/03/2017   BUN 10 10/03/2017   CO2 25 10/03/2017    Assessment/Plan:  Weight loss -10 pound weight loss since April.   -discussed possible causes such as malignancy, medications, ulcers, GERD, stricture, etc. -Results reviewed include CT chest, abdomen, pelvis without contrast from 11/07/2017--b/l Renal cyst with additional complicated lesion at upper pole kidney 2.3 x 2.2 x 2 cm slightly decreased in size since previous exam, favor complicated cyst.  B/l thyroid nodules follow-up thyroid sonography recommended to assess.  Nonspecific 6 mm subpleural nodule right lower lobe recommend noncontrast CT chest in 6 to 12 months -encouraged to try eating a few small meals/day. -Consider boost or Ensure supplemental drink does not feel like eating. - Plan: CBC with Differential/Platelet, Comprehensive metabolic panel, TSH, T4, free, Hep C Antibody, Urinalysis with Reflex Microscopic, HIV antibody (with reflex)  Cigarette nicotine dependence without complication -Smoking cessation counseling greater than 3, less than 10 minutes -Discussed cutting down -We will continue to address each office visit.  Follow-up PRN  Grier Mitts, MD

## 2018-02-22 LAB — HEPATITIS C ANTIBODY
Hepatitis C Ab: NONREACTIVE
SIGNAL TO CUT-OFF: 0.03 (ref <1.00)

## 2018-02-22 LAB — HIV ANTIBODY (ROUTINE TESTING W REFLEX): HIV: NONREACTIVE

## 2018-02-26 ENCOUNTER — Ambulatory Visit: Payer: Self-pay | Admitting: Family Medicine

## 2018-02-26 NOTE — Telephone Encounter (Signed)
Pt called to report left lower abdominal/pelvic pain and lower left back pain. Pt stated that she has been having the lower left back pain, but now the pelvic pain had her call to make appointment. Pt stated the abdominal/pelvic pain started 2 days ago. Pt states the pain is constant, sharp and rates the pain a 5-6 out of 10. Pt stated that she has never had this pain before. Pt thinks it could be gall or kidney stones.Pt stated sitting makes the pain better but the pain is still present.Pt stated she was having urgency with urination and loose stools. Pt advised to make an appointment within 24 hours. No available appointments with PCP. Offered to make an appointment with another provider but pt wants to wait to see Dr Volanda Napoleon on Friday. Appt made for 03/01/18 at 1:30 pm. Care advice given and pt verbalized understanding. Pt stated if she becomes worse, she will go to the ED.   Reason for Disposition . [1] MODERATE pain (e.g., interferes with normal activities) AND [2] pain comes and goes (cramps) AND [3] present > 24 hours  (Exception: pain with Vomiting or Diarrhea - see that Guideline)  Answer Assessment - Initial Assessment Questions 1. LOCATION: "Where does it hurt?"      Left lower abdomen and radiates to lower left back 2. RADIATION: "Does the pain shoot anywhere else?" (e.g., chest, back)     back 3. ONSET: "When did the pain begin?" (e.g., minutes, hours or days ago)      2 days 4. SUDDEN: "Gradual or sudden onset?"     gradual 5. PATTERN "Does the pain come and go, or is it constant?"    - If constant: "Is it getting better, staying the same, or worsening?"      (Note: Constant means the pain never goes away completely; most serious pain is constant and it progresses)     - If intermittent: "How long does it last?" "Do you have pain now?"     (Note: Intermittent means the pain goes away completely between bouts)     Constant-5-10 minutes- worsening 6. SEVERITY: "How bad is the pain?"   (e.g., Scale 1-10; mild, moderate, or severe)   - MILD (1-3): doesn't interfere with normal activities, abdomen soft and not tender to touch    - MODERATE (4-7): interferes with normal activities or awakens from sleep, tender to touch    - SEVERE (8-10): excruciating pain, doubled over, unable to do any normal activities      5-6 out of 10 sharp pain  7. RECURRENT SYMPTOM: "Have you ever had this type of abdominal pain before?" If so, ask: "When was the last time?" and "What happened that time?"      no 8. CAUSE: "What do you think is causing the abdominal pain?"     Gall stones or kidney stones 9. RELIEVING/AGGRAVATING FACTORS: "What makes it better or worse?" (e.g., movement, antacids, bowel movement)     Sitting makes the pain better 10. OTHER SYMPTOMS: "Has there been any vomiting, diarrhea, constipation, or urine problems?"       Urine urgency, diarrhea 11. PREGNANCY: "Is there any chance you are pregnant?" "When was your last menstrual period?"       n/a  Protocols used: ABDOMINAL PAIN - [facility]

## 2018-02-27 NOTE — Telephone Encounter (Signed)
FYI

## 2018-02-28 ENCOUNTER — Encounter: Payer: Self-pay | Admitting: Family Medicine

## 2018-03-01 ENCOUNTER — Encounter: Payer: Self-pay | Admitting: Family Medicine

## 2018-03-01 ENCOUNTER — Ambulatory Visit (INDEPENDENT_AMBULATORY_CARE_PROVIDER_SITE_OTHER): Payer: Medicare Other | Admitting: Family Medicine

## 2018-03-01 VITALS — BP 112/68 | HR 74 | Temp 97.9°F | Wt 117.0 lb

## 2018-03-01 DIAGNOSIS — R634 Abnormal weight loss: Secondary | ICD-10-CM | POA: Diagnosis not present

## 2018-03-01 DIAGNOSIS — R1032 Left lower quadrant pain: Secondary | ICD-10-CM | POA: Diagnosis not present

## 2018-03-01 LAB — POC HEMOCCULT BLD/STL (HOME/3-CARD/SCREEN)
Card #2 Fecal Occult Blod, POC: NEGATIVE
Fecal Occult Blood, POC: NEGATIVE

## 2018-03-01 NOTE — Progress Notes (Signed)
Subjective:    Patient ID: Susan Hawkins, female    DOB: 1951-04-24, 67 y.o.   MRN: [id number]  No chief complaint on file.   HPI Patient was seen today for f/u.  Pt had back pain and LLQ abdominal pain at the beginning of the wk.  Pt notes the pain was similar to when she had a kidney stone.  Pt notes pain has since resolved.  Pt still having 1 loose BM q am.  Pt's appetite has increased slightly since last OFV.  She has also been drinking boost.  Pt denies fever, chills, n/v, diarrhea, constipation.  Pt brought in a stool sample to the lab.  Pt inquires about FL2 forms.  States she needs to find new housing.  Pt was living with one of her sisters, but states the sister up and left.  Pt cannot afford to stay in the apt by herself.  She moved in with her other sister, but endorses increased stress as her sister is on dialysis and has an EtOH problem.  Past Medical History:  Diagnosis Date  . Chronic kidney disease   . Depression   . Hyperlipidemia   . Suicidal ideation   . UTI (urinary tract infection)     Allergies  Allergen Reactions  . Shellfish Allergy     Other  . Iodine Nausea And Vomiting    ROS General: Denies fever, chills, night sweats, changes in weight, changes in appetite HEENT: Denies headaches, ear pain, changes in vision, rhinorrhea, sore throat CV: Denies CP, palpitations, SOB, orthopnea Pulm: Denies SOB, cough, wheezing GI: Denies abdominal pain, nausea, vomiting, diarrhea, constipation  +LLQ pain--now resolved, soft BMs GU: Denies dysuria, hematuria, frequency, vaginal discharge Msk: Denies muscle cramps, joint pains +back pain-- now resolved Neuro: Denies weakness, numbness, tingling Skin: Denies rashes, bruising Psych: Denies depression, anxiety, hallucinations  +stress     Objective:    Blood pressure 112/68, pulse 74, temperature 97.9 F (36.6 C), temperature source Oral, weight 117 lb (53.1 kg), SpO2 97 %.   Gen. Pleasant, well-nourished,  in no distress, normal affect, tearful at times Lungs: no accessory muscle use Cardiovascular: RRR, no peripheral edema Abdomen: BS present, soft, NT/ND, no hepatosplenomegaly. Musculoskeletal: No deformities, no cyanosis or clubbing, normal tone Neuro:  A&Ox3, CN II-XII intact, normal gait   Wt Readings from Last 3 Encounters:  03/01/18 117 lb (53.1 kg)  02/21/18 116 lb (52.6 kg)  11/19/17 119 lb (54 kg)    Lab Results  Component Value Date   WBC 5.4 02/21/2018   HGB 14.5 02/21/2018   HCT 43.0 02/21/2018   PLT 178.0 02/21/2018   GLUCOSE 89 02/21/2018   ALT 10 02/21/2018   AST 23 02/21/2018   NA 138 02/21/2018   K 4.9 02/21/2018   CL 103 02/21/2018   CREATININE 1.08 02/21/2018   BUN 12 02/21/2018   CO2 29 02/21/2018   TSH 1.48 02/21/2018    Assessment/Plan:  Weight loss  -1 lb wt gain since last OFV on 02/21/18 -discussed reducing stress -encouraged to try to eat several small meals - Plan: POC Hemoccult Bld/Stl (3-Cd Home Screen), POC Hemoccult Bld/Stl (3-Cd Home Screen)  Left lower quadrant abdominal pain of unknown etiology  -discussed lab results from previous OFV, UA had small hgb, trace leuks, and 7-10/hpf WBCs on micro -discussed LLQ pain, now resolved, likely 2/2 small kidney stone - Plan: POCT Urinalysis Dipstick (Automated)   Pt was unable to give urine sample while in clinic.  F/u  prn  Grier Mitts, MD

## 2018-03-03 ENCOUNTER — Encounter: Payer: Self-pay | Admitting: Family Medicine

## 2018-03-05 ENCOUNTER — Ambulatory Visit (INDEPENDENT_AMBULATORY_CARE_PROVIDER_SITE_OTHER): Payer: Medicare Other | Admitting: Psychiatry

## 2018-03-05 ENCOUNTER — Encounter (HOSPITAL_COMMUNITY): Payer: Self-pay | Admitting: Psychiatry

## 2018-03-05 VITALS — BP 145/79 | HR 66 | Ht 66.0 in | Wt 118.0 lb

## 2018-03-05 DIAGNOSIS — F411 Generalized anxiety disorder: Secondary | ICD-10-CM

## 2018-03-05 DIAGNOSIS — F1210 Cannabis abuse, uncomplicated: Secondary | ICD-10-CM

## 2018-03-05 DIAGNOSIS — F330 Major depressive disorder, recurrent, mild: Secondary | ICD-10-CM | POA: Diagnosis not present

## 2018-03-05 MED ORDER — ESCITALOPRAM OXALATE 10 MG PO TABS: 10.0000 mg | ORAL_TABLET | Freq: Every day | ORAL | 1 refills | Status: DC
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
## 2018-03-12 ENCOUNTER — Telehealth: Payer: Self-pay

## 2018-03-12 ENCOUNTER — Ambulatory Visit (INDEPENDENT_AMBULATORY_CARE_PROVIDER_SITE_OTHER): Payer: Medicare Other | Admitting: Licensed Clinical Social Worker

## 2018-03-12 ENCOUNTER — Telehealth: Payer: Self-pay | Admitting: Family Medicine

## 2018-03-12 DIAGNOSIS — F330 Major depressive disorder, recurrent, mild: Secondary | ICD-10-CM

## 2018-03-12 NOTE — Telephone Encounter (Signed)
Called pt left a message for pt to return my call regarding her question on her medication

## 2018-03-12 NOTE — Telephone Encounter (Signed)
Pt came in and dropped off a Medical Certification for application and renewal of disability parking placard.  Pt would like to pick this form and the FL-2 form up by the end of the week so that she can take it to the offices of each of the facilities.  Upon completion pt would like to be called  [phone] to pick both forms up.  Gave to [employer]
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
## 2018-03-13 NOTE — Telephone Encounter (Signed)
Noted  

## 2018-03-13 NOTE — Telephone Encounter (Signed)
Pt came by and picked up the incomplete forms (FL-2 and Placard) and made an appointment for next Wednesday.

## 2018-03-13 NOTE — Telephone Encounter (Signed)
Spoke with pt voiced understanding that dr Volanda Napoleon received her FL2 and the Placard, Pt is aware that dr Volanda Napoleon did not approve her request for the completion of the Fl2/Placard. Pt stated to hold the forms and she will pick them up from our office

## 2018-03-14 ENCOUNTER — Telehealth: Payer: Self-pay

## 2018-03-14 NOTE — Telephone Encounter (Signed)
Pt dropped a  Placard and an FL2 at the office for completion, pt was notified that Dr Volanda Napoleon could not complete the FL2 due to that pt did not have reasons or know which facility she was moving to. Pt does not also have reasons on her medical history that qualifies her foe a Handicap Placard. Pt picked up the forms from the office on 03/13/2018

## 2018-03-14 NOTE — Telephone Encounter (Signed)
Copied from [place] [id number]. Topic: Inquiry >> Mar 11, 2018  3:17 PM Vernona Rieger wrote: Reason for CRM: Patient is wondering if the office ever received her FL2 that was faxed over last Thursday or Friday. Patient would like a call back @ [phone]

## 2018-03-20 ENCOUNTER — Encounter: Payer: Self-pay | Admitting: Family Medicine

## 2018-03-20 ENCOUNTER — Ambulatory Visit (INDEPENDENT_AMBULATORY_CARE_PROVIDER_SITE_OTHER): Payer: Medicare Other

## 2018-03-20 ENCOUNTER — Ambulatory Visit (INDEPENDENT_AMBULATORY_CARE_PROVIDER_SITE_OTHER): Payer: Medicare Other | Admitting: Family Medicine

## 2018-03-20 VITALS — BP 130/64 | HR 78 | Temp 98.1°F | Wt 115.0 lb

## 2018-03-20 DIAGNOSIS — G8929 Other chronic pain: Secondary | ICD-10-CM

## 2018-03-20 DIAGNOSIS — M25561 Pain in right knee: Secondary | ICD-10-CM

## 2018-03-20 DIAGNOSIS — M542 Cervicalgia: Secondary | ICD-10-CM

## 2018-03-20 MED ORDER — PREDNISONE 10 MG PO TABS: ORAL_TABLET | ORAL | 0 refills | Status: DC

## 2018-03-20 NOTE — Patient Instructions (Addendum)
Knee Pain, Adult Knee pain in adults is common. It can be caused by many things, including:  Arthritis.  A fluid-filled sac (cyst) or growth in your knee.  An infection in your knee.  An injury that will not heal.  Damage, swelling, or irritation of the tissues that support your knee.  Knee pain is usually not a sign of a serious problem. The pain may go away on its own with time and rest. If it does not, a health care provider may order tests to find the cause of the pain. These may include:  Imaging tests, such as an X-ray, MRI, or ultrasound.  Joint aspiration. In this test, fluid is removed from the knee.  Arthroscopy. In this test, a lighted tube is inserted into knee and an image is projected onto a TV screen.  A biopsy. In this test, a sample of tissue is removed from the body and studied under a microscope.  Follow these instructions at home: Pay attention to any changes in your symptoms. Take these actions to relieve your pain. Activity  Rest your knee.  Do not do things that cause pain or make pain worse.  Avoid high-impact activities or exercises, such as running, jumping rope, or doing jumping jacks. General instructions  Take over-the-counter and prescription medicines only as told by your health care provider.  Raise (elevate) your knee above the level of your heart when you are sitting or lying down.  Sleep with a pillow under your knee.  If directed, apply ice to the knee: ? Put ice in a plastic bag. ? Place a towel between your skin and the bag. ? Leave the ice on for 20 minutes, 2-3 times a day.  Ask your health care provider if you should wear an elastic knee support.  Lose weight if you are overweight. Extra weight can put pressure on your knee.  Do not use any products that contain nicotine or tobacco, such as cigarettes and e-cigarettes. Smoking may slow the healing of any bone and joint problems that you may have. If you need help quitting, ask  your health care provider. Contact a health care provider if:  Your knee pain continues, changes, or gets worse.  You have a fever along with knee pain.  Your knee buckles or locks up.  Your knee swells, and the swelling becomes worse. Get help right away if:  Your knee feels warm to the touch.  You cannot move your knee.  You have severe pain in your knee.  You have chest pain.  You have trouble breathing. Summary  Knee pain in adults is common. It can be caused by many things, including, arthritis, infection, cysts, or injury.  Knee pain is usually not a sign of a serious problem, but if it does not go away, a health care provider may perform tests to know the cause of the pain.  Pay attention to any changes in your symptoms. Relieve your pain with rest, medicines, light activity, and use of ice.  Get help if your pain continues or becomes very severe, or if your knee buckles or locks up, or if you have chest pain or trouble breathing. This information is not intended to replace advice given to you by your health care provider. Make sure you discuss any questions you have with your health care provider. Document Released: 03/19/2007 Document Revised: 05/12/2016 Document Reviewed: 05/12/2016 Elsevier Interactive Patient Education  2018 [employer]. Prednisone tablets What is this medicine? PREDNISONE (PRED ni  sone) is a corticosteroid. It is commonly used to treat inflammation of the skin, joints, lungs, and other organs. Common conditions treated include asthma, allergies, and arthritis. It is also used for other conditions, such as blood disorders and diseases of the adrenal glands. This medicine may be used for other purposes; ask your health care provider or pharmacist if you have questions. COMMON BRAND NAME(S): Deltasone, Predone, Sterapred, Sterapred DS What should I tell my health care provider before I take this medicine? They need to know if you have any of these  conditions: -Cushing's syndrome -diabetes -glaucoma -heart disease -high blood pressure -infection (especially a virus infection such as chickenpox, cold sores, or herpes) -kidney disease -liver disease -mental illness -myasthenia gravis -osteoporosis -seizures -stomach or intestine problems -thyroid disease -an unusual or allergic reaction to lactose, prednisone, other medicines, foods, dyes, or preservatives -pregnant or trying to get pregnant -breast-feeding How should I use this medicine? Take this medicine by mouth with a glass of water. Follow the directions on the prescription label. Take this medicine with food. If you are taking this medicine once a day, take it in the morning. Do not take more medicine than you are told to take. Do not suddenly stop taking your medicine because you may develop a severe reaction. Your doctor will tell you how much medicine to take. If your doctor wants you to stop the medicine, the dose may be slowly lowered over time to avoid any side effects. Talk to your pediatrician regarding the use of this medicine in children. Special care may be needed. Overdosage: If you think you have taken too much of this medicine contact a poison control center or emergency room at once. NOTE: This medicine is only for you. Do not share this medicine with others. What if I miss a dose? If you miss a dose, take it as soon as you can. If it is almost time for your next dose, talk to your doctor or health care professional. You may need to miss a dose or take an extra dose. Do not take double or extra doses without advice. What may interact with this medicine? Do not take this medicine with any of the following medications: -metyrapone -mifepristone This medicine may also interact with the following medications: -aminoglutethimide -amphotericin B -aspirin and aspirin-like medicines -barbiturates -certain medicines for diabetes, like glipizide or  glyburide -cholestyramine -cholinesterase inhibitors -cyclosporine -digoxin -diuretics -ephedrine -female hormones, like estrogens and birth control pills -isoniazid -ketoconazole -NSAIDS, medicines for pain and inflammation, like ibuprofen or naproxen -phenytoin -rifampin -toxoids -vaccines -warfarin This list may not describe all possible interactions. Give your health care provider a list of all the medicines, herbs, non-prescription drugs, or dietary supplements you use. Also tell them if you smoke, drink alcohol, or use illegal drugs. Some items may interact with your medicine. What should I watch for while using this medicine? Visit your doctor or health care professional for regular checks on your progress. If you are taking this medicine over a prolonged period, carry an identification card with your name and address, the type and dose of your medicine, and your doctor's name and address. This medicine may increase your risk of getting an infection. Tell your doctor or health care professional if you are around anyone with measles or chickenpox, or if you develop sores or blisters that do not heal properly. If you are going to have surgery, tell your doctor or health care professional that you have taken this medicine within the last  twelve months. Ask your doctor or health care professional about your diet. You may need to lower the amount of salt you eat. This medicine may affect blood sugar levels. If you have diabetes, check with your doctor or health care professional before you change your diet or the dose of your diabetic medicine. What side effects may I notice from receiving this medicine? Side effects that you should report to your doctor or health care professional as soon as possible: -allergic reactions like skin rash, itching or hives, swelling of the face, lips, or tongue -changes in emotions or moods -changes in vision -depressed mood -eye pain -fever or chills,  cough, sore throat, pain or difficulty passing urine -increased thirst -swelling of ankles, feet Side effects that usually do not require medical attention (report to your doctor or health care professional if they continue or are bothersome): -confusion, excitement, restlessness -headache -nausea, vomiting -skin problems, acne, thin and shiny skin -trouble sleeping -weight gain This list may not describe all possible side effects. Call your doctor for medical advice about side effects. You may report side effects to FDA at 1-800-FDA-1088. Where should I keep my medicine? Keep out of the reach of children. Store at room temperature between 15 and 30 degrees C (59 and 86 degrees F). Protect from light. Keep container tightly closed. Throw away any unused medicine after the expiration date. NOTE: This sheet is a summary. It may not cover all possible information. If you have questions about this medicine, talk to your doctor, pharmacist, or health care provider.  2018 Elsevier/Gold Standard (2011-01-05 10:57:14)

## 2018-03-20 NOTE — Progress Notes (Signed)
Subjective:    Patient ID: Susan Hawkins, female    DOB: 1950-06-28, 67 y.o.   MRN: [id number]  No chief complaint on file.   HPI Patient was seen today for ongoing concern.  Pt notes R knee pain x several yrs.  Pt states in the past she had fluid removed from her knee.  Pt denies injury.  Pt also notes h/o cervical neck pain 2/2 arthritis and a cyst.  Pt has forms (FL2 and Handicap placard) with her that this pt was unable to complete.  Pt notes she had a handicap placard x 10 yrs.  Pt was trying to get housing?, through [facility] and [facility].  Pt unsure where she is trying to live.  Past Medical History:  Diagnosis Date  . Chronic kidney disease   . Depression   . Hyperlipidemia   . Suicidal ideation   . UTI (urinary tract infection)     Allergies  Allergen Reactions  . Shellfish Allergy     Other  . Iodine Nausea And Vomiting    ROS General: Denies fever, chills, night sweats, changes in weight, changes in appetite HEENT: Denies headaches, ear pain, changes in vision, rhinorrhea, sore throat CV: Denies CP, palpitations, SOB, orthopnea Pulm: Denies SOB, cough, wheezing GI: Denies abdominal pain, nausea, vomiting, diarrhea, constipation GU: Denies dysuria, hematuria, frequency, vaginal discharge Msk: Denies muscle cramps, joint pains  +R knee pain, neck pain Neuro: Denies weakness, tingling  +numbness in fingers Skin: Denies rashes, bruising Psych: Denies depression, anxiety, hallucinations     Objective:    Blood pressure 130/64, pulse 78, temperature 98.1 F (36.7 C), temperature source Oral, weight 115 lb (52.2 kg), SpO2 98 %.   Gen. Pleasant, well-nourished, in no distress, normal affect   Lungs: no accessory muscle use Cardiovascular: RRR, no peripheral edema Musculoskeletal: No TTP of spine.  R knee with mild edema compared to L, no effusion, TTP of medial joint line, crepitus.  L knee normal.  No deformities, no cyanosis or clubbing,  normal tone Neuro:  A&Ox3, CN II-XII intact, normal gait  Wt Readings from Last 3 Encounters:  03/20/18 115 lb (52.2 kg)  03/01/18 117 lb (53.1 kg)  02/21/18 116 lb (52.6 kg)    Lab Results  Component Value Date   WBC 5.4 02/21/2018   HGB 14.5 02/21/2018   HCT 43.0 02/21/2018   PLT 178.0 02/21/2018   GLUCOSE 89 02/21/2018   ALT 10 02/21/2018   AST 23 02/21/2018   NA 138 02/21/2018   K 4.9 02/21/2018   CL 103 02/21/2018   CREATININE 1.08 02/21/2018   BUN 12 02/21/2018   CO2 29 02/21/2018   TSH 1.48 02/21/2018    Assessment/Plan:  Chronic pain of right knee -discussed causes including arthritis  - Plan: DG Knee Complete 4 Views Right, predniSONE (DELTASONE) 10 MG tablet  Cervical pain (neck) -pt advised to f/u with neurosurgery given episodes of numbness in fingers. -will place referral -imaging done through [facility]?  F/u prn  Grier Mitts, MD

## 2018-03-29 ENCOUNTER — Ambulatory Visit (INDEPENDENT_AMBULATORY_CARE_PROVIDER_SITE_OTHER): Payer: Medicare Other | Admitting: Family Medicine

## 2018-03-29 ENCOUNTER — Other Ambulatory Visit: Payer: Self-pay | Admitting: Family Medicine

## 2018-03-29 ENCOUNTER — Encounter: Payer: Self-pay | Admitting: Family Medicine

## 2018-03-29 VITALS — BP 126/60 | HR 66 | Temp 98.1°F | Wt 124.0 lb

## 2018-03-29 DIAGNOSIS — Z13820 Encounter for screening for osteoporosis: Secondary | ICD-10-CM

## 2018-03-29 DIAGNOSIS — G4700 Insomnia, unspecified: Secondary | ICD-10-CM

## 2018-03-29 DIAGNOSIS — N644 Mastodynia: Secondary | ICD-10-CM | POA: Diagnosis not present

## 2018-03-29 NOTE — Patient Instructions (Addendum)
Breast Tenderness Breast tenderness is a common problem for women of all ages. Breast tenderness may cause mild discomfort to severe pain. The pain usually comes and goes in association with your menstrual cycle, but it can be constant. Breast tenderness has many possible causes, including hormone changes and some medicines. Your health care provider may order tests, such as a mammogram or an ultrasound, to check for any unusual findings. Having breast tenderness usually does not mean that you have breast cancer. Follow these instructions at home: Sometimes, reassurance that you do not have breast cancer is all that is needed. In general, follow these home care instructions: Managing pain and discomfort  If directed, apply ice to the area: ? Put ice in a plastic bag. ? Place a towel between your skin and the bag. ? Leave the ice on for 20 minutes, 2-3 times a day.  Make sure you are wearing a supportive bra, especially during exercise. You may also want to wear a supportive bra while sleeping if your breasts are very tender. Medicines  Take over-the-counter and prescription medicines only as told by your health care provider. If the cause of your pain is infection, you may be prescribed an antibiotic medicine.  If you were prescribed an antibiotic, take it as told by your health care provider. Do not stop taking the antibiotic even if you start to feel better. General instructions  Your health care provider may recommend that you reduce the amount of fat in your diet. You can do this by: ? Limiting fried foods. ? Cooking foods using methods, such as baking, boiling, grilling, and broiling.  Decrease the amount of caffeine in your diet. You can do this by drinking more water and choosing caffeine-free options.  Keep a log of the days and times when your breasts are most tender.  Ask your health care provider how to do breast exams at home. This will help you notice if you have an unusual  growth or lump. Contact a health care provider if:  Any part of your breast is hard, red, and hot to the touch. This may be a sign of infection.  You are not breastfeeding and you have fluid, especially blood or pus, coming out of your nipples.  You have a fever.  You have a new or painful lump in your breast that remains after your menstrual period ends.  Your pain does not improve or it gets worse.  Your pain is interfering with your daily activities. This information is not intended to replace advice given to you by your health care provider. Make sure you discuss any questions you have with your health care provider. Document Released: 05/04/2008 Document Revised: 02/18/2016 Document Reviewed: 02/18/2016 Elsevier Interactive Patient Education  2018 [employer]. Insomnia Insomnia is a sleep disorder that makes it difficult to fall asleep or to stay asleep. Insomnia can cause tiredness (fatigue), low energy, difficulty concentrating, mood swings, and poor performance at work or school. There are three different ways to classify insomnia:  Difficulty falling asleep.  Difficulty staying asleep.  Waking up too early in the morning.  Any type of insomnia can be long-term (chronic) or short-term (acute). Both are common. Short-term insomnia usually lasts for three months or less. Chronic insomnia occurs at least three times a week for longer than three months. What are the causes? Insomnia may be caused by another condition, situation, or substance, such as:  Anxiety.  Certain medicines.  Gastroesophageal reflux disease (GERD) or other gastrointestinal  conditions.  Asthma or other breathing conditions.  Restless legs syndrome, sleep apnea, or other sleep disorders.  Chronic pain.  Menopause. This may include hot flashes.  Stroke.  Abuse of alcohol, tobacco, or illegal drugs.  Depression.  Caffeine.  Neurological disorders, such as Alzheimer disease.  An  overactive thyroid (hyperthyroidism).  The cause of insomnia may not be known. What increases the risk? Risk factors for insomnia include:  Gender. Women are more commonly affected than men.  Age. Insomnia is more common as you get older.  Stress. This may involve your professional or personal life.  Income. Insomnia is more common in people with lower income.  Lack of exercise.  Irregular work schedule or night shifts.  Traveling between different time zones.  What are the signs or symptoms? If you have insomnia, trouble falling asleep or trouble staying asleep is the main symptom. This may lead to other symptoms, such as:  Feeling fatigued.  Feeling nervous about going to sleep.  Not feeling rested in the morning.  Having trouble concentrating.  Feeling irritable, anxious, or depressed.  How is this treated? Treatment for insomnia depends on the cause. If your insomnia is caused by an underlying condition, treatment will focus on addressing the condition. Treatment may also include:  Medicines to help you sleep.  Counseling or therapy.  Lifestyle adjustments.  Follow these instructions at home:  Take medicines only as directed by your health care provider.  Keep regular sleeping and waking hours. Avoid naps.  Keep a sleep diary to help you and your health care provider figure out what could be causing your insomnia. Include: ? When you sleep. ? When you wake up during the night. ? How well you sleep. ? How rested you feel the next day. ? Any side effects of medicines you are taking. ? What you eat and drink.  Make your bedroom a comfortable place where it is easy to fall asleep: ? Put up shades or special blackout curtains to block light from outside. ? Use a white noise machine to block noise. ? Keep the temperature cool.  Exercise regularly as directed by your health care provider. Avoid exercising right before bedtime.  Use relaxation techniques to  manage stress. Ask your health care provider to suggest some techniques that may work well for you. These may include: ? Breathing exercises. ? Routines to release muscle tension. ? Visualizing peaceful scenes.  Cut back on alcohol, caffeinated beverages, and cigarettes, especially close to bedtime. These can disrupt your sleep.  Do not overeat or eat spicy foods right before bedtime. This can lead to digestive discomfort that can make it hard for you to sleep.  Limit screen use before bedtime. This includes: ? Watching TV. ? Using your smartphone, tablet, and computer.  Stick to a routine. This can help you fall asleep faster. Try to do a quiet activity, brush your teeth, and go to bed at the same time each night.  Get out of bed if you are still awake after 15 minutes of trying to sleep. Keep the lights down, but try reading or doing a quiet activity. When you feel sleepy, go back to bed.  Make sure that you drive carefully. Avoid driving if you feel very sleepy.  Keep all follow-up appointments as directed by your health care provider. This is important. Contact a health care provider if:  You are tired throughout the day or have trouble in your daily routine due to sleepiness.  You continue to  have sleep problems or your sleep problems get worse. Get help right away if:  You have serious thoughts about hurting yourself or someone else. This information is not intended to replace advice given to you by your health care provider. Make sure you discuss any questions you have with your health care provider. Document Released: 05/19/2000 Document Revised: 10/22/2015 Document Reviewed: 02/20/2014 Elsevier Interactive Patient Education  [employer].

## 2018-03-29 NOTE — Progress Notes (Signed)
Subjective:    Patient ID: Susan Hawkins, female    DOB: 09-01-1950, 67 y.o.   MRN: [id number]  No chief complaint on file.   HPI Patient was seen today for acute concern.  Pt endorses R breast tenderness x a few days.  Pt notes h/o dense breast tissue, R breast biopsy with clip placement.  Last mammogram was a few yrs ago.  Pt denies nipple d/c, inversion, skin changes.  Pt notes recent insomnia.  States lays in bed unable to fall asleep.  Pt unsure if caused by the increase in Lexapro.  Pt recently started on prednisone taper, completed yesterday.  Pt tried drinking Marykay Lex to fall asleep without success.  Past Medical History:  Diagnosis Date  . Chronic kidney disease   . Depression   . Hyperlipidemia   . Suicidal ideation   . UTI (urinary tract infection)     Allergies  Allergen Reactions  . Shellfish Allergy     Other  . Iodine Nausea And Vomiting    ROS General: Denies fever, chills, night sweats, changes in weight, changes in appetite HEENT: Denies headaches, ear pain, changes in vision, rhinorrhea, sore throat CV: Denies CP, palpitations, SOB, orthopnea Pulm: Denies SOB, cough, wheezing GI: Denies abdominal pain, nausea, vomiting, diarrhea, constipation GU: Denies dysuria, hematuria, frequency, vaginal discharge Msk: Denies muscle cramps, joint pains  +breast tenderness, R Neuro: Denies weakness, numbness, tingling Skin: Denies rashes, bruising Psych: Denies depression, anxiety, hallucinations  +insomnia      Objective:    Blood pressure 126/60, pulse 66, temperature 98.1 F (36.7 C), temperature source Oral, weight 124 lb (56.2 kg), SpO2 98 %.  Gen. Pleasant, well-nourished, in no distress, normal affect   Lungs: no accessory muscle use, CTAB, no wheezes or rales Cardiovascular: RRR, no m/r/g, no peripheral edema Breast: no deformities noted, well healed incision of R upper breast, no nipple d/c, peau d'orange, on nipple inversion.  TTP of R  breast LUQ at 11-12 o"clock position, L breast with TTP in RUQ at 3o'clock position, numerous cystic lesions palpated throughout b/l breast.  Wt Readings from Last 3 Encounters:  03/29/18 124 lb (56.2 kg)  03/20/18 115 lb (52.2 kg)  03/01/18 117 lb (53.1 kg)    Lab Results  Component Value Date   WBC 5.4 02/21/2018   HGB 14.5 02/21/2018   HCT 43.0 02/21/2018   PLT 178.0 02/21/2018   GLUCOSE 89 02/21/2018   ALT 10 02/21/2018   AST 23 02/21/2018   NA 138 02/21/2018   K 4.9 02/21/2018   CL 103 02/21/2018   CREATININE 1.08 02/21/2018   BUN 12 02/21/2018   CO2 29 02/21/2018   TSH 1.48 02/21/2018    Assessment/Plan:  Breast tenderness  -will send pt for diagnostic mammogram.  Referral placed. - Plan: MM DIAG BREAST TOMO BILATERAL  Insomnia, unspecified type -discussed possible causes including medications such as prednisone or lexapro -Discussed sleep hygiene -See if sleep improves now that patient has finished prednisone.  If not consider changing timing of day patient takes Lexapro. -Advised not to rely on alcohol as a sleep aid.  Screening for osteoporosis  - Plan: DG Bone Density  Follow-up PRN  Grier Mitts, MD

## 2018-03-30 ENCOUNTER — Other Ambulatory Visit (HOSPITAL_COMMUNITY): Payer: Self-pay | Admitting: Psychiatry

## 2018-03-30 DIAGNOSIS — F411 Generalized anxiety disorder: Secondary | ICD-10-CM

## 2018-04-12 ENCOUNTER — Encounter: Payer: Self-pay | Admitting: Family Medicine

## 2018-04-12 ENCOUNTER — Ambulatory Visit (INDEPENDENT_AMBULATORY_CARE_PROVIDER_SITE_OTHER): Payer: Medicare Other | Admitting: Family Medicine

## 2018-04-12 VITALS — BP 140/70 | HR 70 | Temp 97.8°F | Wt 123.0 lb

## 2018-04-12 DIAGNOSIS — Z029 Encounter for administrative examinations, unspecified: Secondary | ICD-10-CM | POA: Diagnosis not present

## 2018-04-12 DIAGNOSIS — M542 Cervicalgia: Secondary | ICD-10-CM | POA: Diagnosis not present

## 2018-04-12 NOTE — Progress Notes (Signed)
Subjective:    Patient ID: Susan Hawkins, female    DOB: [birth year], 67 y.o.   MRN: [id number]  No chief complaint on file.   HPI Patient was seen today for follow-up on ongoing concerns.  Pt endorses making the appointment thinking she would have more information from her neurosurgery visit.  Pt states she was seen by neurosurgery and MRI neck was recommended.  MRI has yet to be scheduled.  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  Pt also has forms with her including a handicap placard and a FL-2.  Pt states she is working with [facility] in regards to placement.  Pt is unsure where she is being placed.  Pt will request handicap placard be completed after results of her MRI.  Past Medical History:  Diagnosis Date  . Chronic kidney disease   . Depression   . Hyperlipidemia   . Suicidal ideation   . UTI (urinary tract infection)     Allergies  Allergen Reactions  . Shellfish Allergy     Other  . Iodine Nausea And Vomiting    ROS General: Denies fever, chills, night sweats, changes in weight, changes in appetite HEENT: Denies headaches, ear pain, changes in vision, rhinorrhea, sore throat CV: Denies CP, palpitations, SOB, orthopnea Pulm: Denies SOB, cough, wheezing GI: Denies abdominal pain, nausea, vomiting, diarrhea, constipation GU: Denies dysuria, hematuria, frequency, vaginal discharge Msk: Denies muscle cramps, joint pains  +neck pain Neuro: Denies weakness, numbness, tingling Skin: Denies rashes, bruising Psych: Denies depression, anxiety, hallucinations    Objective:    Blood pressure 140/70, pulse 70, temperature 97.8 F (36.6 C), temperature source Oral, weight 123 lb (55.8 kg), SpO2 98 %.  Gen. Pleasant, well-nourished, in no distress, normal affect   HEENT: [place]/AT, face symmetric, no scleral icterus, nares patent without drainage Lungs: no accessory muscle use Cardiovascular: RRR, no peripheral edema Neuro:  A&Ox3,  CN II-XII intact, normal gait  Wt Readings from Last 3 Encounters:  04/12/18 123 lb (55.8 kg)  03/29/18 124 lb (56.2 kg)  03/20/18 115 lb (52.2 kg)    Lab Results  Component Value Date   WBC 5.4 02/21/2018   HGB 14.5 02/21/2018   HCT 43.0 02/21/2018   PLT 178.0 02/21/2018   GLUCOSE 89 02/21/2018   ALT 10 02/21/2018   AST 23 02/21/2018   NA 138 02/21/2018   K 4.9 02/21/2018   CL 103 02/21/2018   CREATININE 1.08 02/21/2018   BUN 12 02/21/2018   CO2 29 02/21/2018   TSH 1.48 02/21/2018    Assessment/Plan:  Neck pain -Continue following with neurosurgery -Awaiting results of MRI neck  Administrative encounter -Await to complete handicap placard based on results of MRI -Still trying to get information regarding pt placement.  Question if pt is trying to go to rehab? -forms in provider's folder  F/u prn  Grier Mitts, MD

## 2018-04-16 ENCOUNTER — Ambulatory Visit: Payer: Self-pay

## 2018-04-16 NOTE — Telephone Encounter (Signed)
Pt c/o mild dizziness that comes and goes. Pt stated that her dizziness began on Monday. Pt stated that when she lays down after feeling lightheaded, she feels like she is spinning. Standing makes the dizziness worse. HR during call was 64 per minute. Pt stated that she thinks it is her BP that is causing the elevation. Pt stated her BP yesterday was 158/60. No BP reading today. Last OV her BP was 140/70 at an OV on 04/12/18. Pt doesn't have a BP machine at home. Pt stated she has had lightheadedness before 2-3 years ago. She stated she was put on a "preventative " BP med. Pt denies fever, chest pain, vomiting or bleeding.  Care advice given to pt and pt verbalized understanding. Appt given for tomorrow with Dr Volanda Napoleon at 10:30 am.  Reason for Disposition . [1] MILD dizziness (e.g., walking normally) AND [2] has NOT been evaluated by physician for this  (Exception: dizziness caused by heat exposure, sudden standing, or poor fluid intake)  Answer Assessment - Initial Assessment Questions 1. DESCRIPTION: "Describe your dizziness."     Lightheadedness- feels like head is spinning 2. LIGHTHEADED: "Do you feel lightheaded?" (e.g., somewhat faint, woozy, weak upon standing)     yes 3. VERTIGO: "Do you feel like either you or the room is spinning or tilting?" (i.e. vertigo)     yes 4. SEVERITY: "How bad is it?"  "Do you feel like you are going to faint?" "Can you stand and walk?"   - MILD - walking normally   - MODERATE - interferes with normal activities (e.g., work, school)    - SEVERE - unable to stand, requires support to walk, feels like passing out now.      mild 5. ONSET:  "When did the dizziness begin?"     Monday 6. AGGRAVATING FACTORS: "Does anything make it worse?" (e.g., standing, change in head position)     standing 7. HEART RATE: "Can you tell me your heart rate?" "How many beats in 15 seconds?"  (Note: not all patients can do this)       64 8. CAUSE: "What do you think is causing the  dizziness?"     Elevated blood pressure 9. RECURRENT SYMPTOM: "Have you had dizziness before?" If so, ask: "When was the last time?" "What happened that time?"     2-3 years ago dx high blood pressure- was put on a "preventative" medication 10. OTHER SYMPTOMS: "Do you have any other symptoms?" (e.g., fever, chest pain, vomiting, diarrhea, bleeding)      no 11. PREGNANCY: "Is there any chance you are pregnant?" "When was your last menstrual period?"       n/a  Protocols used: DIZZINESS [facility]

## 2018-04-17 ENCOUNTER — Ambulatory Visit (INDEPENDENT_AMBULATORY_CARE_PROVIDER_SITE_OTHER): Payer: Medicare Other | Admitting: Family Medicine

## 2018-04-17 ENCOUNTER — Encounter: Payer: Self-pay | Admitting: Family Medicine

## 2018-04-17 VITALS — BP 136/60 | HR 66 | Temp 98.2°F | Wt 122.0 lb

## 2018-04-17 DIAGNOSIS — I10 Essential (primary) hypertension: Secondary | ICD-10-CM | POA: Diagnosis not present

## 2018-04-17 DIAGNOSIS — R350 Frequency of micturition: Secondary | ICD-10-CM

## 2018-04-17 DIAGNOSIS — F17210 Nicotine dependence, cigarettes, uncomplicated: Secondary | ICD-10-CM | POA: Diagnosis not present

## 2018-04-17 DIAGNOSIS — E785 Hyperlipidemia, unspecified: Secondary | ICD-10-CM | POA: Diagnosis not present

## 2018-04-17 MED ORDER — VARENICLINE TARTRATE 0.5 MG X 11 & 1 MG X 42 PO MISC: ORAL | 0 refills | Status: DC

## 2018-04-17 MED ORDER — SIMVASTATIN 20 MG PO TABS: 20.0000 mg | ORAL_TABLET | Freq: Every day | ORAL | 3 refills | Status: DC

## 2018-04-17 MED ORDER — LISINOPRIL 2.5 MG PO TABS: 2.5000 mg | ORAL_TABLET | Freq: Every day | ORAL | 3 refills | Status: DC

## 2018-04-17 NOTE — Patient Instructions (Addendum)
Varenicline oral tablets What is this medicine? VARENICLINE (var EN i kleen) is used to help people quit smoking. It can reduce the symptoms caused by stopping smoking. It is used with a patient support program recommended by your physician. This medicine may be used for other purposes; ask your health care provider or pharmacist if you have questions. COMMON BRAND NAME(S): Chantix What should I tell my health care provider before I take this medicine? They need to know if you have any of these conditions: -bipolar disorder, depression, schizophrenia or other mental illness -heart disease -if you often drink alcohol -kidney disease -peripheral vascular disease -seizures -stroke -suicidal thoughts, plans, or attempt; a previous suicide attempt by you or a family member -an unusual or allergic reaction to varenicline, other medicines, foods, dyes, or preservatives -pregnant or trying to get pregnant -breast-feeding How should I use this medicine? Take this medicine by mouth after eating. Take with a full glass of water. Follow the directions on the prescription label. Take your doses at regular intervals. Do not take your medicine more often than directed. There are 3 ways you can use this medicine to help you quit smoking; talk to your health care professional to decide which plan is right for you: 1) you can choose a quit date and start this medicine 1 week before the quit date, or, 2) you can start taking this medicine before you choose a quit date, and then pick a quit date between day 8 and 35 days of treatment, or, 3) if you are not sure that you are able or willing to quit smoking right away, start taking this medicine and slowly decrease the amount you smoke as directed by your health care professional with the goal of being cigarette-free by week 12 of treatment. Stick to your plan; ask about support groups or other ways to help you remain cigarette-free. If you are motivated to quit  smoking and did not succeed during a previous attempt with this medicine for reasons other than side effects, or if you returned to smoking after this treatment, speak with your health care professional about whether another course of this medicine may be right for you. A special MedGuide will be given to you by the pharmacist with each prescription and refill. Be sure to read this information carefully each time. Talk to your pediatrician regarding the use of this medicine in children. This medicine is not approved for use in children. Overdosage: If you think you have taken too much of this medicine contact a poison control center or emergency room at once. NOTE: This medicine is only for you. Do not share this medicine with others. What if I miss a dose? If you miss a dose, take it as soon as you can. If it is almost time for your next dose, take only that dose. Do not take double or extra doses. What may interact with this medicine? -alcohol or any product that contains alcohol -insulin -other stop smoking aids -theophylline -warfarin This list may not describe all possible interactions. Give your health care provider a list of all the medicines, herbs, non-prescription drugs, or dietary supplements you use. Also tell them if you smoke, drink alcohol, or use illegal drugs. Some items may interact with your medicine. What should I watch for while using this medicine? Visit your doctor or health care professional for regular check ups. Ask for ongoing advice and encouragement from your doctor or healthcare professional, friends, and family to help you quit. If   you smoke while on this medication, quit again Your mouth may get dry. Chewing sugarless gum or sucking hard candy, and drinking plenty of water may help. Contact your doctor if the problem does not go away or is severe. You may get drowsy or dizzy. Do not drive, use machinery, or do anything that needs mental alertness until you know how  this medicine affects you. Do not stand or sit up quickly, especially if you are an older patient. This reduces the risk of dizzy or fainting spells. Sleepwalking can happen during treatment with this medicine, and can sometimes lead to behavior that is harmful to you, other people, or property. Stop taking this medicine and tell your doctor if you start sleepwalking or have other unusual sleep-related activity. Decrease the amount of alcoholic beverages that you drink during treatment with this medicine until you know if this medicine affects your ability to tolerate alcohol. Some people have experienced increased drunkenness (intoxication), unusual or sometimes aggressive behavior, or no memory of things that have happened (amnesia) during treatment with this medicine. The use of this medicine may increase the chance of suicidal thoughts or actions. Pay special attention to how you are responding while on this medicine. Any worsening of mood, or thoughts of suicide or dying should be reported to your health care professional right away. What side effects may I notice from receiving this medicine? Side effects that you should report to your doctor or health care professional as soon as possible: -allergic reactions like skin rash, itching or hives, swelling of the face, lips, tongue, or throat -acting aggressive, being angry or violent, or acting on dangerous impulses -breathing problems -changes in vision -chest pain or chest tightness -confusion, trouble speaking or understanding -new or worsening depression, anxiety, or panic attacks -extreme increase in activity and talking (mania) -fast, irregular heartbeat -feeling faint or lightheaded, falls -fever -pain in legs when walking -problems with balance, talking, walking -redness, blistering, peeling or loosening of the skin, including inside the mouth -ringing in ears -seeing or hearing things that aren't there  (hallucinations) -seizures -sleepwalking -sudden numbness or weakness of the face, arm or leg -thoughts about suicide or dying, or attempts to commit suicide -trouble passing urine or change in the amount of urine -unusual bleeding or bruising -unusually weak or tired Side effects that usually do not require medical attention (report to your doctor or health care professional if they continue or are bothersome): -constipation -headache -nausea, vomiting -strange dreams -stomach gas -trouble sleeping This list may not describe all possible side effects. Call your doctor for medical advice about side effects. You may report side effects to FDA at 1-800-FDA-1088. Where should I keep my medicine? Keep out of the reach of children. Store at room temperature between 15 and 30 degrees C (59 and 86 degrees F). Throw away any unused medicine after the expiration date. NOTE: This sheet is a summary. It may not cover all possible information. If you have questions about this medicine, talk to your doctor, pharmacist, or health care provider.  2018 Elsevier/Gold Standard (2015-02-04 16:14:23)  How to Take Your Blood Pressure You can take your blood pressure at home with a machine. You may need to check your blood pressure at home:  To check if you have high blood pressure (hypertension).  To check your blood pressure over time.  To make sure your blood pressure medicine is working.  Supplies needed: You will need a blood pressure machine, or monitor. You can buy one  at a drugstore or online. When choosing one:  Choose one with an arm cuff.  Choose one that wraps around your upper arm. Only one finger should fit between your arm and the cuff.  Do not choose one that measures your blood pressure from your wrist or finger.  Your doctor can suggest a monitor. How to prepare Avoid these things for 30 minutes before checking your blood pressure:  Drinking caffeine.  Drinking  alcohol.  Eating.  Smoking.  Exercising.  Five minutes before checking your blood pressure:  Pee.  Sit in a dining chair. Avoid sitting in a soft couch or armchair.  Be quiet. Do not talk.  How to take your blood pressure Follow the instructions that came with your machine. If you have a digital blood pressure monitor, these may be the instructions: 1. Sit up straight. 2. Place your feet on the floor. Do not cross your ankles or legs. 3. Rest your left arm at the level of your heart. You may rest it on a table, desk, or chair. 4. Pull up your shirt sleeve. 5. Wrap the blood pressure cuff around the upper part of your left arm. The cuff should be 1 inch (2.5 cm) above your elbow. It is best to wrap the cuff around bare skin. 6. Fit the cuff snugly around your arm. You should be able to place only one finger between the cuff and your arm. 7. Put the cord inside the groove of your elbow. 8. Press the power button. 9. Sit quietly while the cuff fills with air and loses air. 10. Write down the numbers on the screen. 11. Wait 2-3 minutes and then repeat steps 1-10.  What do the numbers mean? Two numbers make up your blood pressure. The first number is called systolic pressure. The second is called diastolic pressure. An example of a blood pressure reading is "120 over 80" (or 120/80). If you are an adult and do not have a medical condition, use this guide to find out if your blood pressure is normal: Normal  First number: below 120.  Second number: below 80. Elevated  First number: 120-129.  Second number: below 80. Hypertension stage 1  First number: 130-139.  Second number: 80-89. Hypertension stage 2  First number: 140 or above.  Second number: 13 or above. Your blood pressure is above normal even if only the top or bottom number is above normal. Follow these instructions at home:  Check your blood pressure as often as your doctor tells you to.  Take your  monitor to your next doctor's appointment. Your doctor will: ? Make sure you are using it correctly. ? Make sure it is working right.  Make sure you understand what your blood pressure numbers should be.  Tell your doctor if your medicines are causing side effects. Contact a doctor if:  Your blood pressure keeps being high. Get help right away if:  Your first blood pressure number is higher than 180.  Your second blood pressure number is higher than 120. This information is not intended to replace advice given to you by your health care provider. Make sure you discuss any questions you have with your health care provider. Document Released: 05/04/2008 Document Revised: 04/19/2016 Document Reviewed: 10/29/2015 Elsevier Interactive Patient Education  2018 [employer].  Steps to Quit Smoking Smoking tobacco can be bad for your health. It can also affect almost every organ in your body. Smoking puts you and people around you at risk for  many serious long-lasting (chronic) diseases. Quitting smoking is hard, but it is one of the best things that you can do for your health. It is never too late to quit. What are the benefits of quitting smoking? When you quit smoking, you lower your risk for getting serious diseases and conditions. They can include:  Lung cancer or lung disease.  Heart disease.  Stroke.  Heart attack.  Not being able to have children (infertility).  Weak bones (osteoporosis) and broken bones (fractures).  If you have coughing, wheezing, and shortness of breath, those symptoms may get better when you quit. You may also get sick less often. If you are pregnant, quitting smoking can help to lower your chances of having a baby of low birth weight. What can I do to help me quit smoking? Talk with your doctor about what can help you quit smoking. Some things you can do (strategies) include:  Quitting smoking totally, instead of slowly cutting back how much you smoke  over a period of time.  Going to in-person counseling. You are more likely to quit if you go to many counseling sessions.  Using resources and support systems, such as: ? Database administrator with a Social worker. ? Phone quitlines. ? Careers information officer. ? Support groups or group counseling. ? Text messaging programs. ? Mobile phone apps or applications.  Taking medicines. Some of these medicines may have nicotine in them. If you are pregnant or breastfeeding, do not take any medicines to quit smoking unless your doctor says it is okay. Talk with your doctor about counseling or other things that can help you.  Talk with your doctor about using more than one strategy at the same time, such as taking medicines while you are also going to in-person counseling. This can help make quitting easier. What things can I do to make it easier to quit? Quitting smoking might feel very hard at first, but there is a lot that you can do to make it easier. Take these steps:  Talk to your family and friends. Ask them to support and encourage you.  Call phone quitlines, reach out to support groups, or work with a Social worker.  Ask people who smoke to not smoke around you.  Avoid places that make you want (trigger) to smoke, such as: ? Bars. ? Parties. ? Smoke-break areas at work.  Spend time with people who do not smoke.  Lower the stress in your life. Stress can make you want to smoke. Try these things to help your stress: ? Getting regular exercise. ? Deep-breathing exercises. ? Yoga. ? Meditating. ? Doing a body scan. To do this, close your eyes, focus on one area of your body at a time from head to toe, and notice which parts of your body are tense. Try to relax the muscles in those areas.  Download or buy apps on your mobile phone or tablet that can help you stick to your quit plan. There are many free apps, such as QuitGuide from the [employer] Office manager for Disease Control and Prevention). You can find  more support from smokefree.gov and other websites.  This information is not intended to replace advice given to you by your health care provider. Make sure you discuss any questions you have with your health care provider. Document Released: 03/18/2009 Document Revised: 01/18/2016 Document Reviewed: 10/06/2014 Elsevier Interactive Patient Education  2018 [employer].

## 2018-04-17 NOTE — Progress Notes (Signed)
Subjective:    Patient ID: Susan Hawkins, female    DOB: 04-30-1951, 67 y.o.   MRN: [id number]  No chief complaint on file.   HPI Patient was seen today for acute concerns.  Pt endorses feeling lightheaded and increased urination.  Pt notes she was feeling sick a few days ago and had 1 episode of emesis after drinking water, then felt lightheaded.  Pt laid down, then notes another episode of lightheadedness.  Pt feeling better now.  Pt denies dysuria or back pain.  Pt also notes elevated bp while at the neurosurgery office, 150/60.  Pt states in the past she was put on a low dose of bp med x 1 month.  Pt has a bp cuff at home, but does not check her bp.  Pt states she will start checking it.    Pt endorses smoking 1 pack of cigarettes per day times numerous years.  Pt has tried patches in the past.  Pt is interested in Chantix.  Refill on simvastatin 20 mg.  Pt has MRI scheduled for today.  Past Medical History:  Diagnosis Date  . Chronic kidney disease   . Depression   . Hyperlipidemia   . Suicidal ideation   . UTI (urinary tract infection)     Allergies  Allergen Reactions  . Shellfish Allergy     Other  . Iodine Nausea And Vomiting    ROS General: Denies fever, chills, night sweats, changes in weight, changes in appetite  +lightheadedness HEENT: Denies headaches, ear pain, changes in vision, rhinorrhea, sore throat CV: Denies CP, palpitations, SOB, orthopnea Pulm: Denies SOB, cough, wheezing GI: Denies abdominal pain, nausea, vomiting, diarrhea, constipation GU: Denies dysuria, hematuria, vaginal discharge  +frequency Msk: Denies muscle cramps, joint pains Neuro: Denies weakness, numbness, tingling Skin: Denies rashes, bruising Psych: Denies depression, anxiety, hallucinations      Objective:    Blood pressure 136/60, pulse 66, temperature 98.2 F (36.8 C), temperature source Oral, weight 122 lb (55.3 kg), SpO2 97 %.   Gen. Pleasant, well-nourished, in  no distress, normal affect   HEENT: [place]/AT, face symmetric, no scleral icterus, PERRLA, nares patent without drainage, pharynx without erythema or exudate. Lungs: no accessory muscle use, CTAB, no wheezes or rales Cardiovascular: RRR, no m/r/g, no peripheral edema Abdomen: BS present, soft, NT/ND Neuro:  A&Ox3, CN II-XII intact, normal gait  Wt Readings from Last 3 Encounters:  04/17/18 122 lb (55.3 kg)  04/12/18 123 lb (55.8 kg)  03/29/18 124 lb (56.2 kg)    Lab Results  Component Value Date   WBC 5.4 02/21/2018   HGB 14.5 02/21/2018   HCT 43.0 02/21/2018   PLT 178.0 02/21/2018   GLUCOSE 89 02/21/2018   ALT 10 02/21/2018   AST 23 02/21/2018   NA 138 02/21/2018   K 4.9 02/21/2018   CL 103 02/21/2018   CREATININE 1.08 02/21/2018   BUN 12 02/21/2018   CO2 29 02/21/2018   TSH 1.48 02/21/2018    Assessment/Plan:  Essential hypertension -Mildly elevated at this visit -Discussed lifestyle modifications -Discussed checking BP daily at home and keeping a log to bring with her to clinic -Patient encouraged to stay hydrated - Plan: lisinopril (ZESTRIL) 2.5 MG tablet  Hyperlipidemia, unspecified hyperlipidemia type - Plan: simvastatin (ZOCOR) 20 MG tablet  Urinary frequency -UA negative  Cigarette nicotine dependence without complication -Smoking cessation counseling greater than 3 minutes, less than 10 minutes -Patient currently smoking 1 pack/day - Plan: varenicline (CHANTIX PAK) 0.5 MG X  11 & 1 MG X 42 tablet  Follow-up PRN in 1 month  Grier Mitts, MD

## 2018-04-30 ENCOUNTER — Telehealth: Payer: Self-pay

## 2018-04-30 NOTE — Telephone Encounter (Signed)
Spoke with pt voiced understanding that her FL2 form is complete and ready for pick up

## 2018-04-30 NOTE — Telephone Encounter (Signed)
FL2 Form completed and patient stopped by the office this afternoon to pick up.

## 2018-05-06 ENCOUNTER — Ambulatory Visit (INDEPENDENT_AMBULATORY_CARE_PROVIDER_SITE_OTHER): Payer: Medicare Other | Admitting: Psychiatry

## 2018-05-06 VITALS — BP 118/68 | HR 72 | Ht 66.0 in | Wt 122.0 lb

## 2018-05-06 DIAGNOSIS — F4312 Post-traumatic stress disorder, chronic: Secondary | ICD-10-CM | POA: Diagnosis not present

## 2018-05-06 DIAGNOSIS — F1210 Cannabis abuse, uncomplicated: Secondary | ICD-10-CM | POA: Diagnosis not present

## 2018-05-06 DIAGNOSIS — F330 Major depressive disorder, recurrent, mild: Secondary | ICD-10-CM | POA: Diagnosis not present

## 2018-05-06 DIAGNOSIS — F411 Generalized anxiety disorder: Secondary | ICD-10-CM

## 2018-05-06 MED ORDER — ESCITALOPRAM OXALATE 10 MG PO TABS: 10.0000 mg | ORAL_TABLET | Freq: Every day | ORAL | 0 refills | Status: DC
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
## 2018-05-31 ENCOUNTER — Other Ambulatory Visit: Payer: Self-pay | Admitting: Family Medicine

## 2018-05-31 DIAGNOSIS — F17210 Nicotine dependence, cigarettes, uncomplicated: Secondary | ICD-10-CM

## 2018-06-03 MED ORDER — VARENICLINE TARTRATE 0.5 MG X 11 & 1 MG X 42 PO MISC: ORAL | 0 refills | Status: DC

## 2018-06-10 ENCOUNTER — Ambulatory Visit (HOSPITAL_COMMUNITY): Payer: Medicare Other | Admitting: Psychiatry

## 2018-06-13 ENCOUNTER — Ambulatory Visit (INDEPENDENT_AMBULATORY_CARE_PROVIDER_SITE_OTHER): Payer: Medicare Other | Admitting: Family Medicine

## 2018-06-13 ENCOUNTER — Encounter: Payer: Self-pay | Admitting: Family Medicine

## 2018-06-13 VITALS — BP 126/70 | HR 66 | Temp 97.8°F | Wt 117.0 lb

## 2018-06-13 DIAGNOSIS — R600 Localized edema: Secondary | ICD-10-CM | POA: Diagnosis not present

## 2018-06-13 NOTE — Progress Notes (Signed)
Subjective:    Patient ID: [place], female    DOB: Dec 13, 1950, 68 y.o.   MRN: [id number]  No chief complaint on file.   HPI Patient was seen today for acute concern.  Pt notes swelling of R palm.  Noticed a few days ago.  Denies pain, itching.  Has not done anything for the area.  Endorses prior injury to area, cut blood vessel in hand/wrist on a window screen yrs ago.  Since incident has numbness in R 5th digit.  Past Medical History:  Diagnosis Date  . Chronic kidney disease   . Depression   . Hyperlipidemia   . Suicidal ideation   . UTI (urinary tract infection)     Allergies  Allergen Reactions  . Shellfish Allergy     Other  . Iodine Nausea And Vomiting    ROS General: Denies fever, chills, night sweats, changes in weight, changes in appetite HEENT: Denies headaches, ear pain, changes in vision, rhinorrhea, sore throat CV: Denies CP, palpitations, SOB, orthopnea Pulm: Denies SOB, cough, wheezing GI: Denies abdominal pain, nausea, vomiting, diarrhea, constipation GU: Denies dysuria, hematuria, frequency, vaginal discharge Msk: Denies muscle cramps, joint pains Neuro: Denies weakness, numbness, tingling Skin: Denies rashes, bruising  +swelling in hand Psych: Denies depression, anxiety, hallucinations    Objective:    Blood pressure 126/70, pulse 66, temperature 97.8 F (36.6 C), temperature source Oral, weight 117 lb (53.1 kg), SpO2 98 %.  Gen. Pleasant, well-nourished, in no distress, normal affect   HEENT: [place]/AT, face symmetric, no scleral icterus, PERRLA, nares patent without drainage Lungs: no accessory muscle use Cardiovascular: RRR, no peripheral edema Neuro:  A&Ox3, CN II-XII intact, normal gait Skin:  Warm, no rash.  R hand palmar surface proximal to medial wrist with slightly raised area with mild edema and fluctuance, not firm.  Well healed laceration on medial edge of the area.  No TTP of the area.  L hand normal.  Wt Readings from Last 3  Encounters:  06/13/18 117 lb (53.1 kg)  04/17/18 122 lb (55.3 kg)  04/12/18 123 lb (55.8 kg)    Lab Results  Component Value Date   WBC 5.4 02/21/2018   HGB 14.5 02/21/2018   HCT 43.0 02/21/2018   PLT 178.0 02/21/2018   GLUCOSE 89 02/21/2018   ALT 10 02/21/2018   AST 23 02/21/2018   NA 138 02/21/2018   K 4.9 02/21/2018   CL 103 02/21/2018   CREATININE 1.08 02/21/2018   BUN 12 02/21/2018   CO2 29 02/21/2018   TSH 1.48 02/21/2018    Assessment/Plan:  Hand edema  -localized area of edema.   -discussed possible causes including cyst-though not as well defined or fluctant as a ganglion cyst, blister, cellulitis -discussed monitoring vs u/s -pt wishes to obtain u/s. -will place order.  If needed will refer to hand surgery based on u/s.  Follow-up PRN  Grier Mitts, MD

## 2018-07-09 ENCOUNTER — Telehealth: Payer: Self-pay | Admitting: Family Medicine

## 2018-07-09 ENCOUNTER — Ambulatory Visit: Payer: Self-pay | Admitting: Family Medicine

## 2018-07-09 ENCOUNTER — Other Ambulatory Visit: Payer: Self-pay | Admitting: Family Medicine

## 2018-07-09 DIAGNOSIS — R600 Localized edema: Secondary | ICD-10-CM

## 2018-07-09 NOTE — Telephone Encounter (Signed)
Pt Cx'd appt for 07/09/2018 d/t never hearing from xray to have her hand scanned.  Pt states that she was supposed to follow up with Dr Volanda Napoleon following an xray of her hand but no one ever called her to set this up. The note from her last OV 06/13/2018 is incomplete so I do not see any notes pertaining to this.  Pt aware that I will check with Dr Volanda Napoleon to see if she is indeed needing a scan of her hand - at that point that patient states she will scheduled her appt with Dr Volanda Napoleon.   Please advise. Thanks.

## 2018-07-10 ENCOUNTER — Other Ambulatory Visit: Payer: Self-pay | Admitting: Family Medicine

## 2018-07-10 DIAGNOSIS — R600 Localized edema: Secondary | ICD-10-CM

## 2018-07-10 NOTE — Telephone Encounter (Signed)
Called pt left a detailed message regarding her referral for her [place] of her hand

## 2018-07-23 ENCOUNTER — Other Ambulatory Visit: Payer: Self-pay | Admitting: Family Medicine

## 2018-07-23 DIAGNOSIS — R600 Localized edema: Secondary | ICD-10-CM

## 2018-07-25 ENCOUNTER — Ambulatory Visit
Admission: RE | Admit: 2018-07-25 | Discharge: 2018-07-25 | Disposition: A | Discharge status: Home / Self Care | Payer: Medicare Other | Source: Ambulatory Visit | Attending: Family Medicine | Admitting: Family Medicine

## 2018-07-25 DIAGNOSIS — R600 Localized edema: Secondary | ICD-10-CM

## 2018-08-04 ENCOUNTER — Other Ambulatory Visit: Payer: Self-pay | Admitting: Family Medicine

## 2018-08-04 DIAGNOSIS — F17210 Nicotine dependence, cigarettes, uncomplicated: Secondary | ICD-10-CM

## 2018-08-05 ENCOUNTER — Encounter (HOSPITAL_COMMUNITY): Payer: Self-pay | Admitting: Psychiatry

## 2018-08-05 ENCOUNTER — Ambulatory Visit (INDEPENDENT_AMBULATORY_CARE_PROVIDER_SITE_OTHER): Payer: Medicare Other | Admitting: Psychiatry

## 2018-08-05 VITALS — BP 128/71 | HR 74 | Ht 66.0 in | Wt 116.0 lb

## 2018-08-05 DIAGNOSIS — F4312 Post-traumatic stress disorder, chronic: Secondary | ICD-10-CM | POA: Diagnosis not present

## 2018-08-05 DIAGNOSIS — F1210 Cannabis abuse, uncomplicated: Secondary | ICD-10-CM | POA: Diagnosis not present

## 2018-08-05 DIAGNOSIS — F330 Major depressive disorder, recurrent, mild: Secondary | ICD-10-CM | POA: Diagnosis not present

## 2018-08-05 MED ORDER — ESCITALOPRAM OXALATE 10 MG PO TABS: 10.0000 mg | ORAL_TABLET | Freq: Every day | ORAL | 0 refills | Status: DC
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
## 2018-08-05 NOTE — Telephone Encounter (Signed)
Ok to refill 

## 2018-08-07 ENCOUNTER — Other Ambulatory Visit: Payer: Self-pay | Admitting: Family Medicine

## 2018-08-07 MED ORDER — VARENICLINE TARTRATE 1 MG PO TABS: 1.0000 mg | ORAL_TABLET | Freq: Two times a day (BID) | ORAL | 3 refills | Status: DC

## 2018-08-22 ENCOUNTER — Encounter: Payer: Medicare Other | Admitting: Family Medicine

## 2018-08-29 ENCOUNTER — Other Ambulatory Visit: Payer: Self-pay

## 2018-08-29 ENCOUNTER — Telehealth (INDEPENDENT_AMBULATORY_CARE_PROVIDER_SITE_OTHER): Payer: Medicare Other | Admitting: Family Medicine

## 2018-08-29 DIAGNOSIS — F17210 Nicotine dependence, cigarettes, uncomplicated: Secondary | ICD-10-CM

## 2018-08-29 NOTE — Progress Notes (Signed)
Virtual Visit via Telephone Note Attempts to reach pt via webex failed.  Pt called on the phone.  I connected with@ on 08/29/18 at 10:30 AM EDT by a telephone and verified that I am speaking with the correct person using two identifiers.  Location patient: home Location provider:work or home office Persons participating in the virtual visit: patient, provider  I discussed the limitations of evaluation and management by telemedicine and the availability of in person appointments. The patient expressed understanding and agreed to proceed.   HPI: Unable to reach via webex.  Pt called on the phone.  Pt wants to restart chantix.  Endorses increased stress looking for housing, having her family members around.  Pt states she could not get the medication.  Thought the refill was denied.   Informed it was sent on 08/07/18.  Pt asking about PCS and handicap placard.  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  States went to a specialist and had an xray of her neck, but was never told the results.  Pt also notes continued swelling in the palm of her R hand.  Pt also mentions paperwork to make a company Holiday representative of the [place]) in charge of her finances?  Pt notes occasional trouble remembering things, but denies trouble paying bills.  Pt states she is going to pay a company $95 to help her find housing.  Pt was initially trying to get into a group home, but states "I guess I don't have enough mental problems for that"?  ROS: See pertinent positives and negatives per HPI.  Past Medical History:  Diagnosis Date  . Chronic kidney disease   . Depression   . Hyperlipidemia   . Suicidal ideation   . UTI (urinary tract infection)     Past Surgical History:  Procedure Laterality Date  . ABDOMINAL HYSTERECTOMY    . BREAST CYST EXCISION Right     Family History  Problem Relation Age of Onset  . Depression Maternal Grandmother   . Alcohol abuse Father   . Anxiety disorder Sister    . Drug abuse Sister     SOCIAL HX: not currently working.  Smoking 1 pk/day, was down to 10 cigs/day on chantix.   Current Outpatient Medications:  .  aspirin EC 81 MG tablet, Take 81 mg by mouth every other day. , Disp: , Rfl:  .  escitalopram (LEXAPRO) 10 MG tablet, Take 1 tablet (10 mg total) by mouth daily., Disp: 90 tablet, Rfl: 0 .  lisinopril (ZESTRIL) 2.5 MG tablet, Take 1 tablet (2.5 mg total) by mouth daily., Disp: 30 tablet, Rfl: 3 .  simvastatin (ZOCOR) 20 MG tablet, Take 1 tablet (20 mg total) by mouth daily., Disp: 90 tablet, Rfl: 3 .  varenicline (CHANTIX CONTINUING MONTH PAK) 1 MG tablet, Take 1 tablet (1 mg total) by mouth 2 (two) times daily., Disp: 60 tablet, Rfl: 3  EXAM:  VITALS per patient if applicable: n/a  GENERAL: Pleasant, oriented, in no acute distress  LUNGS:Breathing normally, no SOB appreciated on phone  PSYCH/NEURO: pleasant and cooperative, no obvious depression or anxiety, speech and thought processing grossly intact  ASSESSMENT AND PLAN:  Discussed the following assessment and plan:  Cigarette nicotine dependence without complication  -Smoking cessation counseling greater than 3 minutes, less than 10 minutes -smoking 1 ppd -Discussed restarting Chantix/generic.  Pt advised to pick up rx from pharmacy -will continue to evaluate at each visit  Pt advised to gather forms that need  completing.  Discussed may not be able to complete form for handicap placard based on pt's current conditions.  Pt advised to consider having provider she sees for arthritis to complete form.    I discussed the assessment and treatment plan with the patient. The patient was provided an opportunity to ask questions and all were answered. The patient agreed with the plan and demonstrated an understanding of the instructions.   The patient was advised to call back or seek an in-person evaluation if the symptoms worsen or if the condition fails to improve as  anticipated.  I provided 21 minutes of non-face-to-face time via phone during this encounter.   Billie Ruddy, MD

## 2018-09-10 ENCOUNTER — Telehealth: Payer: Self-pay | Admitting: *Deleted

## 2018-09-10 NOTE — Telephone Encounter (Signed)
Copied from [place] [id number]. Topic: General - Other >> Sep 10, 2018  9:30 AM Virl Axe D wrote: Reason for CRM: Pt is requesting order for mammogram and CT scan. She stated she does them every year. Please advise. CB#[phone]

## 2018-09-11 NOTE — Telephone Encounter (Signed)
Called pt left a detailed message regarding the orders requested

## 2018-09-17 ENCOUNTER — Other Ambulatory Visit (HOSPITAL_COMMUNITY): Payer: Self-pay

## 2018-09-17 DIAGNOSIS — F4312 Post-traumatic stress disorder, chronic: Secondary | ICD-10-CM

## 2018-09-17 DIAGNOSIS — F330 Major depressive disorder, recurrent, mild: Secondary | ICD-10-CM

## 2018-09-17 DIAGNOSIS — F1210 Cannabis abuse, uncomplicated: Secondary | ICD-10-CM

## 2018-09-17 MED ORDER — ESCITALOPRAM OXALATE 5 MG PO TABS: 5.0000 mg | ORAL_TABLET | Freq: Every day | ORAL | 0 refills | Status: DC

## 2018-09-17 NOTE — Progress Notes (Unsigned)
Patient called to say she started taking the 5 mg Lexapro with her 10 mg and it seems to be working, she has the 10 mg tabs and needs a refill on the 5 mg. I sent order for 30 days to the pharmacy.

## 2018-09-18 ENCOUNTER — Other Ambulatory Visit: Payer: Self-pay | Admitting: Family Medicine

## 2018-09-18 NOTE — Telephone Encounter (Signed)
Spoke with pt aware that there ar orders pending for both CT and Mammogram

## 2018-10-16 ENCOUNTER — Other Ambulatory Visit (HOSPITAL_COMMUNITY): Payer: Self-pay | Admitting: Psychiatry

## 2018-11-05 ENCOUNTER — Encounter (HOSPITAL_COMMUNITY): Payer: Self-pay | Admitting: Psychiatry

## 2018-11-05 ENCOUNTER — Ambulatory Visit (INDEPENDENT_AMBULATORY_CARE_PROVIDER_SITE_OTHER): Payer: Medicare Other | Admitting: Psychiatry

## 2018-11-05 ENCOUNTER — Other Ambulatory Visit: Payer: Self-pay

## 2018-11-05 DIAGNOSIS — F330 Major depressive disorder, recurrent, mild: Secondary | ICD-10-CM | POA: Diagnosis not present

## 2018-11-05 DIAGNOSIS — F4312 Post-traumatic stress disorder, chronic: Secondary | ICD-10-CM

## 2018-11-05 DIAGNOSIS — F1210 Cannabis abuse, uncomplicated: Secondary | ICD-10-CM | POA: Diagnosis not present

## 2018-11-05 MED ORDER — ESCITALOPRAM OXALATE 20 MG PO TABS: 20.0000 mg | ORAL_TABLET | Freq: Every day | ORAL | 1 refills | Status: AC
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
## 2018-11-13 ENCOUNTER — Other Ambulatory Visit: Payer: Self-pay | Admitting: Family Medicine

## 2018-11-15 NOTE — Telephone Encounter (Signed)
LVM for pt to call the office regarding her Chantix Rx

## 2018-11-19 ENCOUNTER — Other Ambulatory Visit (HOSPITAL_COMMUNITY): Payer: Self-pay | Admitting: Psychiatry

## 2018-11-19 DIAGNOSIS — F4312 Post-traumatic stress disorder, chronic: Secondary | ICD-10-CM

## 2018-11-19 DIAGNOSIS — F1210 Cannabis abuse, uncomplicated: Secondary | ICD-10-CM

## 2018-11-19 DIAGNOSIS — F330 Major depressive disorder, recurrent, mild: Secondary | ICD-10-CM

## 2018-12-04 ENCOUNTER — Other Ambulatory Visit: Payer: Self-pay

## 2018-12-04 ENCOUNTER — Encounter: Payer: Self-pay | Admitting: Family Medicine

## 2018-12-04 ENCOUNTER — Ambulatory Visit (INDEPENDENT_AMBULATORY_CARE_PROVIDER_SITE_OTHER): Payer: Medicare Other | Admitting: Family Medicine

## 2018-12-04 ENCOUNTER — Ambulatory Visit (HOSPITAL_COMMUNITY): Payer: Medicare Other | Admitting: Psychiatry

## 2018-12-04 VITALS — BP 110/60 | HR 89 | Temp 98.0°F | Wt 110.0 lb

## 2018-12-04 DIAGNOSIS — M542 Cervicalgia: Secondary | ICD-10-CM | POA: Diagnosis not present

## 2018-12-04 DIAGNOSIS — R911 Solitary pulmonary nodule: Secondary | ICD-10-CM | POA: Diagnosis not present

## 2018-12-04 DIAGNOSIS — R350 Frequency of micturition: Secondary | ICD-10-CM | POA: Diagnosis not present

## 2018-12-04 DIAGNOSIS — F17211 Nicotine dependence, cigarettes, in remission: Secondary | ICD-10-CM | POA: Diagnosis not present

## 2018-12-04 LAB — POC URINALSYSI DIPSTICK (AUTOMATED)
Bilirubin, UA: NEGATIVE
Blood, UA: NEGATIVE
Glucose, UA: NEGATIVE
Ketones, UA: NEGATIVE
Leukocytes, UA: NEGATIVE
Protein, UA: POSITIVE — AB
Spec Grav, UA: 1.025 (ref 1.010–1.025)
Urobilinogen, UA: 0.2 E.U./dL
pH, UA: 6 (ref 5.0–8.0)

## 2018-12-04 MED ORDER — CAPSAICIN 0.025 % EX CREA: TOPICAL_CREAM | Freq: Two times a day (BID) | CUTANEOUS | 1 refills | Status: AC

## 2018-12-04 NOTE — Progress Notes (Signed)
Subjective:    Patient ID: Susan Hawkins, female    DOB: [birth year], 68 y.o.   MRN: [id number]  No chief complaint on file.   HPI Patient was seen today for acute concern.  Pt endorses urinary frequency and darker color x a few days.  Pt concerned about UTI.  Pt denies fever, chills, back pain.  States may drink 2 bottles of water per day.  Pt mentions she moved to [place].  States she is living with her niece.  States had to get away from [place] as Pt also notes she has not had a cigarette in 21 days.  Pt states she is using "the medicine you gave me" to help her quit.  Pt also wants to make sure she has f/u scheduled for a lung nodule noted on CT in 2018.  Pt inquires if she needs COVID-19 testing.  Pt is asymptomatic, has been wearing a mask, and staying away from people.  Pt asks about a cream for her neck.  States used a friend's zostrix which helped.  Pt has a h/o cervical neck pain previously seen by a specialist at [facility].  Pt does not recall being seen by a specialist.  Pt also request forms be completed for driving.  Pt does not have any forms with her, nor were any sent to the office.    Past Medical History:  Diagnosis Date  . Chronic kidney disease   . Depression   . Hyperlipidemia   . Suicidal ideation   . UTI (urinary tract infection)     Allergies  Allergen Reactions  . Shellfish Allergy     Other  . Iodine Nausea And Vomiting    ROS General: Denies fever, chills, night sweats, changes in weight, changes in appetite HEENT: Denies headaches, ear pain, changes in vision, rhinorrhea, sore throat CV: Denies CP, palpitations, SOB, orthopnea Pulm: Denies SOB, cough, wheezing GI: Denies abdominal pain, nausea, vomiting, diarrhea, constipation GU: Denies dysuria, hematuria, vaginal discharge  +urinary frequency and color change Msk: Denies muscle cramps, joint pains  +neck pain Neuro: Denies weakness, numbness, tingling Skin: Denies rashes,  bruising Psych: Denies depression, anxiety, hallucinations      Objective:    Blood pressure 110/60, pulse 89, temperature 98 F (36.7 C), temperature source Oral, weight 110 lb (49.9 kg), SpO2 98 %.   Gen. Pleasant, well-nourished, in no distress, normal affect   HEENT: [place]/AT, face symmetric, no scleral icterus, PERRLA, EOMI, nares patent without drainage Lungs: no accessory muscle use, CTAB, no wheezes or rales Cardiovascular: RRR, no m/r/g, no peripheral edema Abdomen: BS present, soft, NT/ND Musculoskeletal: No deformities, no cyanosis or clubbing, normal tone Neuro:  A&Ox3, CN II-XII intact, normal gait Skin:  Warm, no lesions/ rash   Wt Readings from Last 3 Encounters:  12/04/18 110 lb (49.9 kg)  06/13/18 117 lb (53.1 kg)  04/17/18 122 lb (55.3 kg)    Lab Results  Component Value Date   WBC 5.4 02/21/2018   HGB 14.5 02/21/2018   HCT 43.0 02/21/2018   PLT 178.0 02/21/2018   GLUCOSE 89 02/21/2018   ALT 10 02/21/2018   AST 23 02/21/2018   NA 138 02/21/2018   K 4.9 02/21/2018   CL 103 02/21/2018   CREATININE 1.08 02/21/2018   BUN 12 02/21/2018   CO2 29 02/21/2018   TSH 1.48 02/21/2018    Assessment/Plan:  Frequency of urination -UA not indicative of infection.  SG 1.025, +protein and nitrites.  No leuks or RBCS -discussed  increasing po intake of water and fluids.  - Plan: POCT Urinalysis Dipstick (Automated)  Neck pain  -pt encouraged to f/u with her specialist. - Plan: capsaicin (ZOSTRIX) 0.025 % cream  Lung nodule -CT scan 09/2016 with 2.3 mm nodular density along the L major fissure.  Repeat CT suggested in 1 yr given pt's h/o smoking. -per chart review pt seen 08/07/2018 by [facility], CT chest w/o contrast was ordered.  -pt advised to make appt for CT.  Offered to place another order or find a place near [place] for pt to have CT done, but she declines as plans to be out of town visiting her god-son. -will continue to encourage pt to have f/u  CT chest.  Cigarette nicotine dependence in remission -pt congratulated on being tobacco free x 21 days. -continued to offer support, 1-800 QUIT NOW -will continue to monitor at each visit.  Unclear as the the form pt wanted to have completed as she does not have it with her.  Pt can send for to office for review if she locates it.    F/u prn  Grier Mitts, MD

## 2018-12-09 ENCOUNTER — Ambulatory Visit (HOSPITAL_COMMUNITY): Payer: Medicare Other | Admitting: Psychiatry

## 2018-12-14 IMAGING — DX DG KNEE COMPLETE 4+V*R*
4 series · 4 of 4 positions shown · non-contrast
Comparison: No recent.

CLINICAL DATA: Right knee pain.

EXAM:
RIGHT KNEE - COMPLETE 4+ VIEW

[knee ap]
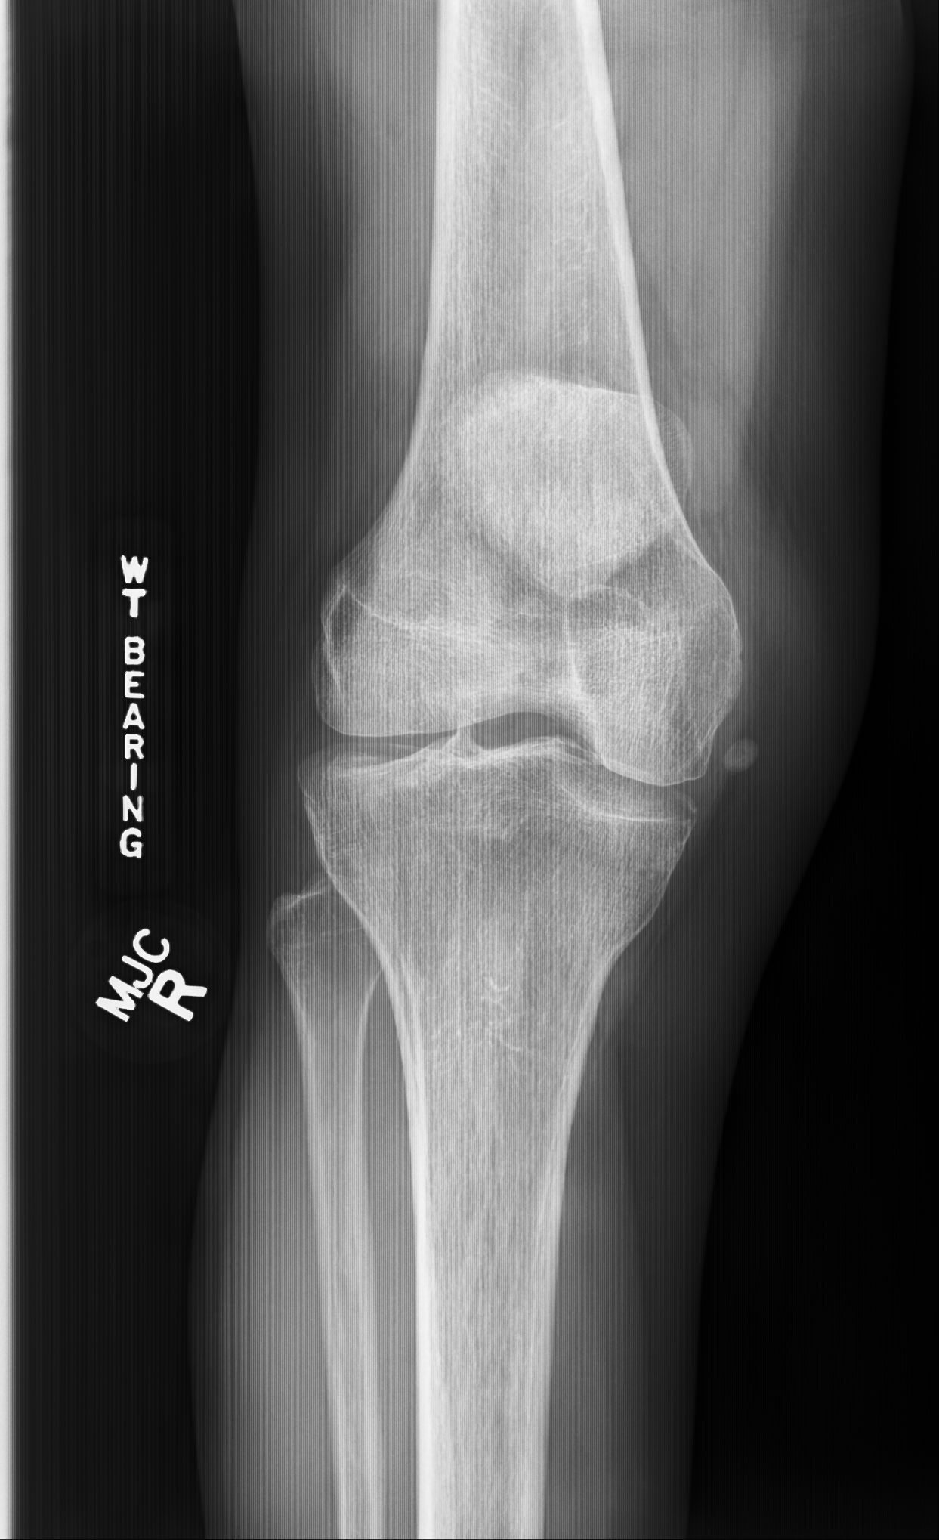

[knee lat]
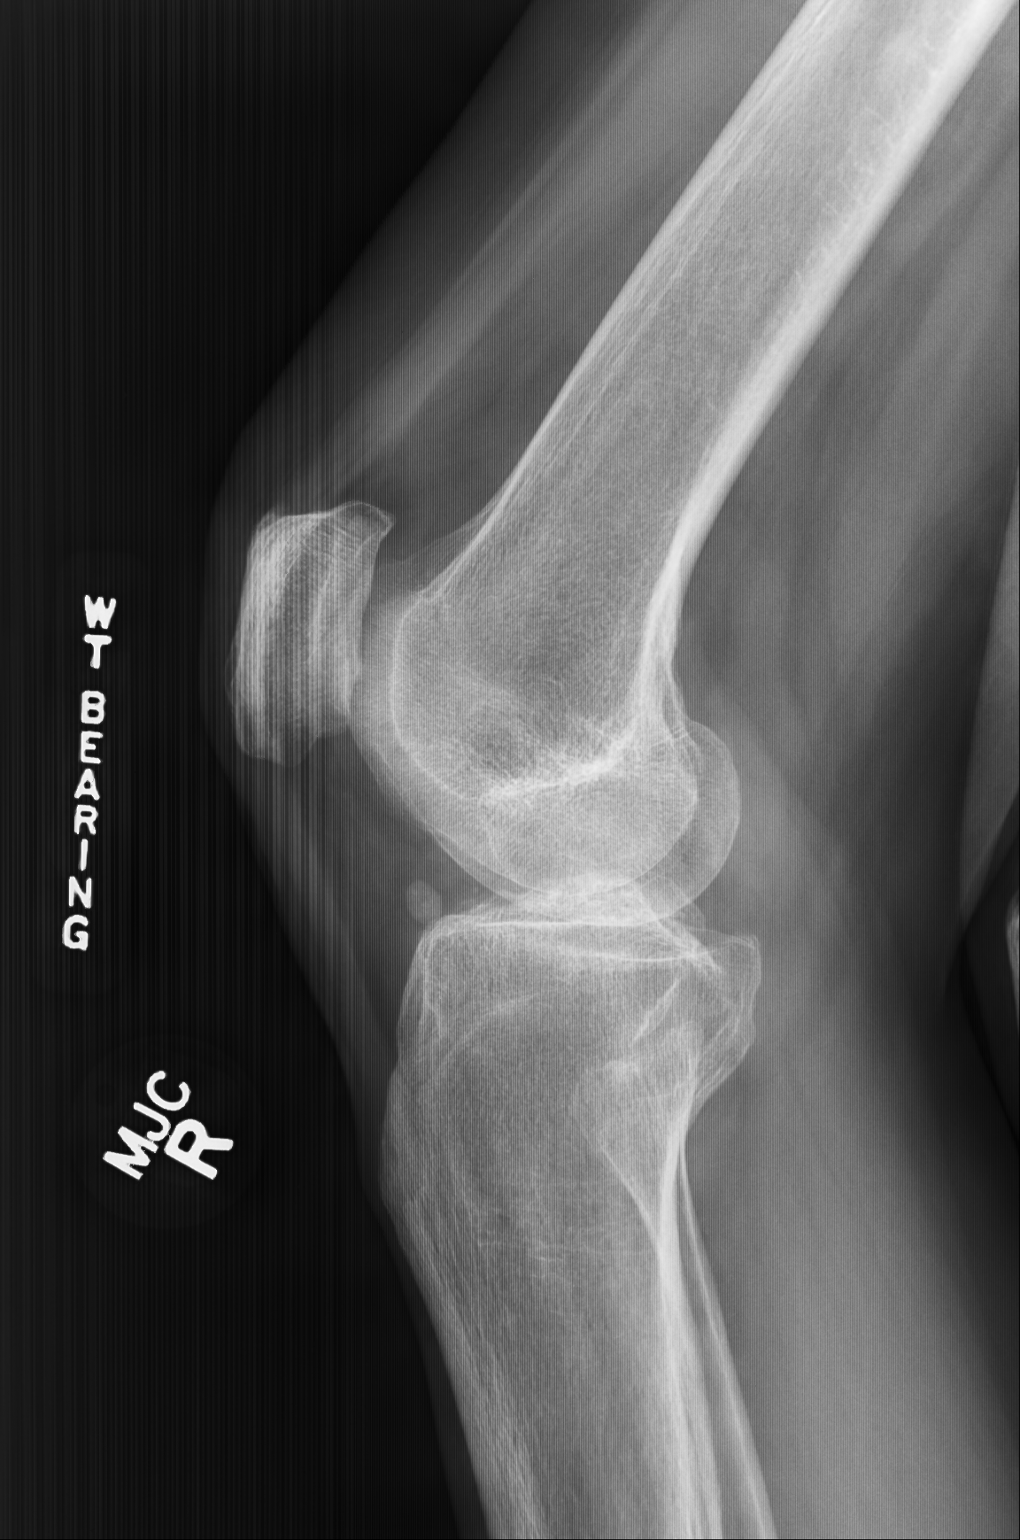

[knee intercondylar (tunnel view)]
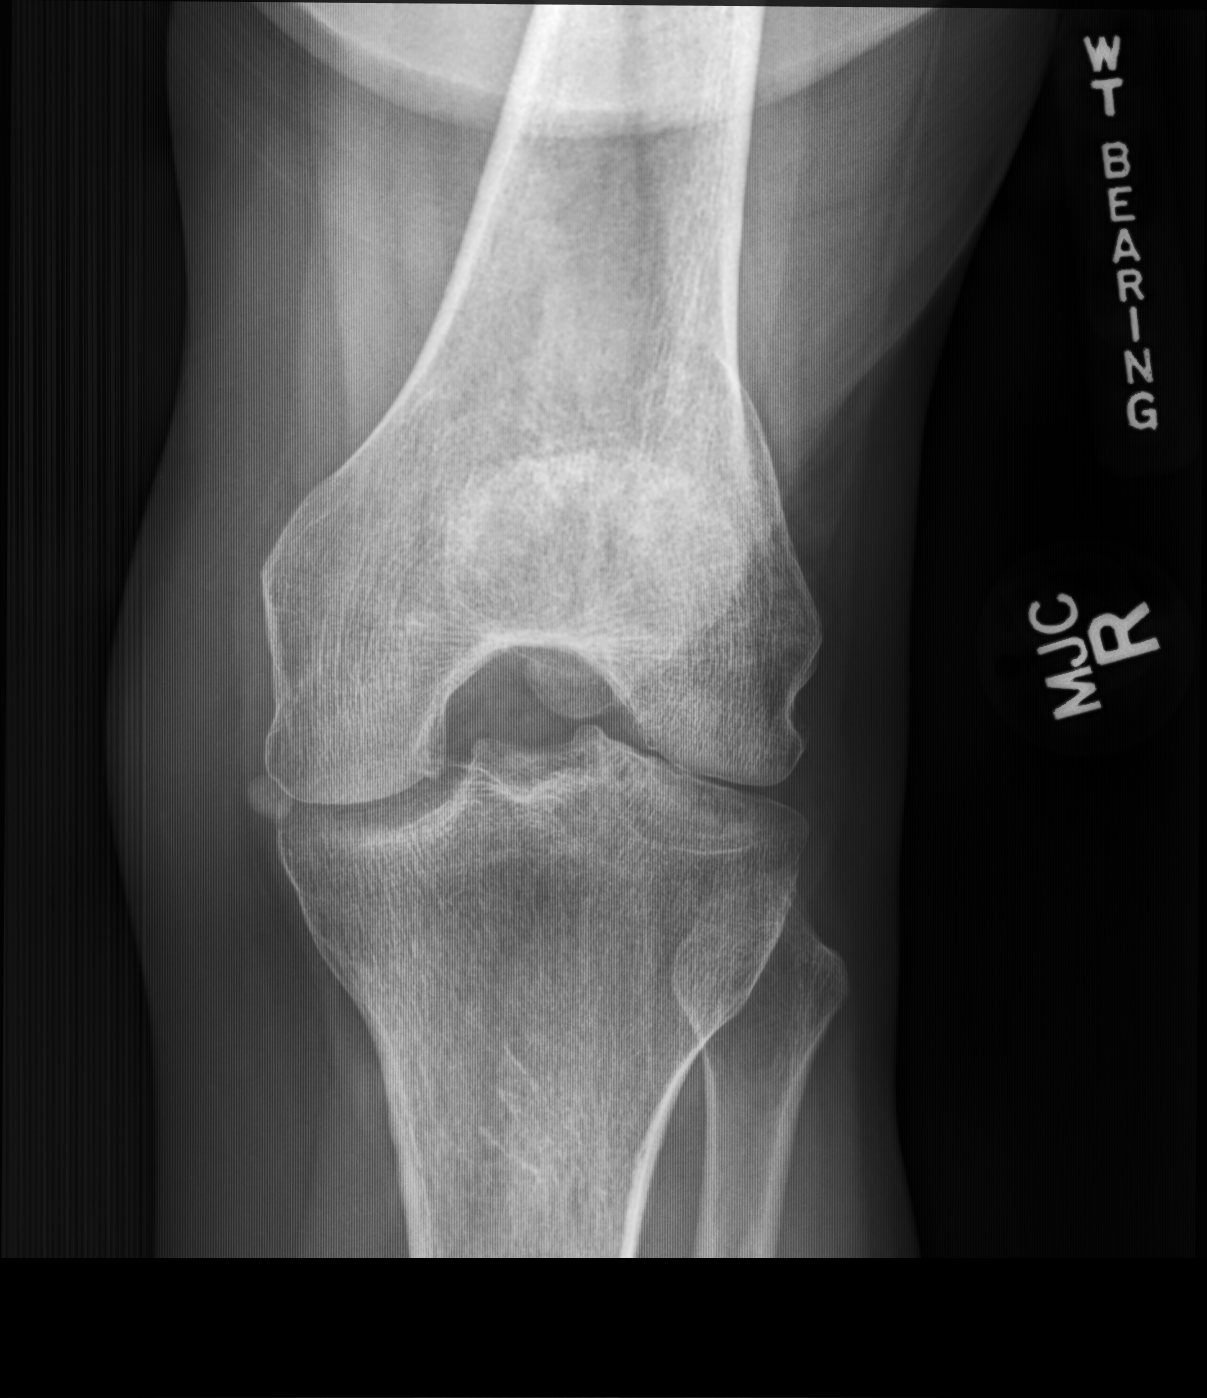

[patella (sunrise) tan]
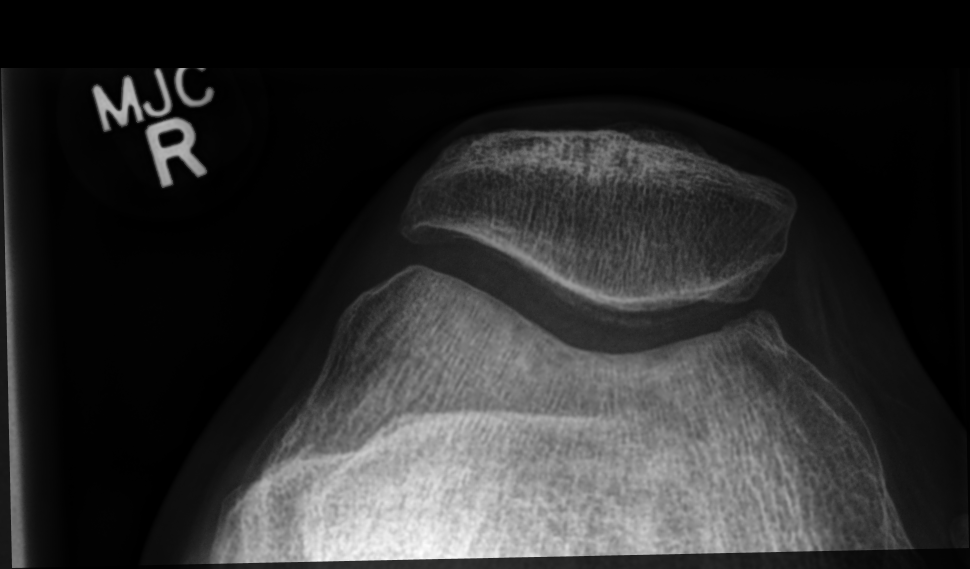

[4 of 4 positions shown; findings below may reference images not displayed]

FINDINGS: No acute bony or joint abnormality identified. Diffuse osteopenia.
Tricompartment degenerative change. Rounded corticated density noted
over the anterior medial joint space, most likely a loose body. No
acute bony abnormality identified. No evidence of fracture
dislocation.
IMPRESSION: Diffuse osteopenia and tricompartment degenerative change. Probable
loose body over the anterior medial joint space.

## 2018-12-16 ENCOUNTER — Telehealth: Payer: Self-pay | Admitting: Family Medicine

## 2018-12-16 NOTE — Telephone Encounter (Signed)
Pt sent in via mail a Medical Certification for application & renewal of disability parking placard form to be completed by the provider.  Upon completion pt did not state what she would like to happen to the form.  Form placed in providers folder for completion.

## 2018-12-18 NOTE — Telephone Encounter (Signed)
Form has been received and placed on Dr Volanda Napoleon desk for completing, pt will be notified when the form is ready for pick up

## 2018-12-23 ENCOUNTER — Telehealth: Payer: Medicare Other | Admitting: Physician Assistant

## 2018-12-23 ENCOUNTER — Encounter: Payer: Self-pay | Admitting: Physician Assistant

## 2018-12-23 DIAGNOSIS — Z20822 Contact with and (suspected) exposure to covid-19: Secondary | ICD-10-CM

## 2018-12-23 DIAGNOSIS — R6889 Other general symptoms and signs: Secondary | ICD-10-CM | POA: Diagnosis not present

## 2018-12-23 DIAGNOSIS — R059 Cough, unspecified: Secondary | ICD-10-CM

## 2018-12-23 DIAGNOSIS — R05 Cough: Secondary | ICD-10-CM | POA: Diagnosis not present

## 2018-12-23 MED ORDER — BENZONATATE 100 MG PO CAPS: 100.0000 mg | ORAL_CAPSULE | Freq: Three times a day (TID) | ORAL | 0 refills | Status: AC | PRN

## 2018-12-23 NOTE — Progress Notes (Signed)
E-Visit for Corona Virus Screening   Your current symptoms could be consistent with the coronavirus.  Many health care providers can now test patients at their office but not all are.  [facility] has multiple testing sites. For information on our COVID testing locations and hours go to [email or web address]  Please quarantine yourself while awaiting your test results.  We are enrolling you in our MyChart Home Montioring for COVID19 . Daily you will receive a questionnaire within the MyChart website. Our COVID 19 response team willl be monitoriing your responses daily.    COVID-19 is a respiratory illness with symptoms that are similar to the flu. Symptoms are typically mild to moderate, but there have been cases of severe illness and death due to the virus. The following symptoms may appear 2-14 days after exposure: . Fever . Cough . Shortness of breath or difficulty breathing . Chills . Repeated shaking with chills . Muscle pain . Headache . Sore throat . New loss of taste or smell . Fatigue . Congestion or runny nose . Nausea or vomiting . Diarrhea  It is vitally important that if you feel that you have an infection such as this virus or any other virus that you stay home and away from places where you may spread it to others.  You should self-quarantine for 14 days if you have symptoms that could potentially be coronavirus or have been in close contact a with a person diagnosed with COVID-19 within the last 2 weeks. You should avoid contact with people age 65 and older.   You should wear a mask or cloth face covering over your nose and mouth if you must be around other people or animals, including pets (even at home). Try to stay at least 6 feet away from other people. This will protect the people around you.  You can use medication such as A prescription cough medication called Tessalon Perles 100 mg. You may take 1-2 capsules every 8 hours as needed for  cough  You may also take acetaminophen (Tylenol) as needed for fever.  I have provided a work note  You have been enrolled in Evisit Covid19 Home Monitoring questionnaire, which will contact you regarding your symptoms.   Reduce your risk of any infection by using the same precautions used for avoiding the common cold or flu:  . Wash your hands often with soap and warm water for at least 20 seconds.  If soap and water are not readily available, use an alcohol-based hand sanitizer with at least 60% alcohol.  . If coughing or sneezing, cover your mouth and nose by coughing or sneezing into the elbow areas of your shirt or coat, into a tissue or into your sleeve (not your hands). . Avoid shaking hands with others and consider head nods or verbal greetings only. . Avoid touching your eyes, nose, or mouth with unwashed hands.  . Avoid close contact with people who are sick. . Avoid places or events with large numbers of people in one location, like concerts or sporting events. . Carefully consider travel plans you have or are making. . If you are planning any travel outside or inside the [place], visit the CDC's Travelers' Health webpage for the latest health notices. . If you have some symptoms but not all symptoms, continue to monitor at home and seek medical attention if your symptoms worsen. . If you are having a medical emergency, call 911.  HOME CARE . Only take medications as instructed by your medical   team. . Drink plenty of fluids and get plenty of rest. . A steam or ultrasonic humidifier can help if you have congestion.   GET HELP RIGHT AWAY IF YOU HAVE EMERGENCY WARNING SIGNS** FOR COVID-19. If you or someone is showing any of these signs seek emergency medical care immediately. Call 911 or proceed to your closest emergency facility if: . You develop worsening high fever. . Trouble breathing . Bluish lips or face . Persistent pain or pressure in the chest . New confusion . Inability  to wake or stay awake . You cough up blood. . Your symptoms become more severe  **This list is not all possible symptoms. Contact your medical provider for any symptoms that are sever or concerning to you.   MAKE SURE YOU   Understand these instructions.  Will watch your condition.  Will get help right away if you are not doing well or get worse.  Your e-visit answers were reviewed by a board certified advanced clinical practitioner to complete your personal care plan.  Depending on the condition, your plan could have included both over the counter or prescription medications.  If there is a problem please reply once you have received a response from your provider.  Your safety is important to us.  If you have drug allergies check your prescription carefully.    You can use MyChart to ask questions about today's visit, request a non-urgent call back, or ask for a work or school excuse for 24 hours related to this e-Visit. If it has been greater than 24 hours you will need to follow up with your provider, or enter a new e-Visit to address those concerns. You will get an e-mail in the next two days asking about your experience.  I hope that your e-visit has been valuable and will speed your recovery. Thank you for using e-visits.   I spent 5-10 minutes on review and completion of this note- Sahar Osman PAC  

## 2018-12-28 ENCOUNTER — Encounter (INDEPENDENT_AMBULATORY_CARE_PROVIDER_SITE_OTHER): Payer: Self-pay

## 2019-01-01 ENCOUNTER — Encounter (INDEPENDENT_AMBULATORY_CARE_PROVIDER_SITE_OTHER): Payer: Self-pay

## 2019-01-14 NOTE — Telephone Encounter (Signed)
LVM for patient informing her that per Dr. Volanda Napoleon she does not have any of the qualifications listed for a handicap placard

## 2019-02-04 ENCOUNTER — Ambulatory Visit (HOSPITAL_COMMUNITY): Payer: Medicare Other | Admitting: Psychiatry

## 2019-02-04 ENCOUNTER — Other Ambulatory Visit: Payer: Self-pay

## 2019-02-04 ENCOUNTER — Other Ambulatory Visit: Payer: Self-pay | Admitting: Family Medicine

## 2019-02-04 DIAGNOSIS — I10 Essential (primary) hypertension: Secondary | ICD-10-CM

## 2019-03-12 DIAGNOSIS — M79672 Pain in left foot: Secondary | ICD-10-CM | POA: Diagnosis not present

## 2019-03-12 DIAGNOSIS — S99922A Unspecified injury of left foot, initial encounter: Secondary | ICD-10-CM | POA: Diagnosis not present

## 2019-03-12 DIAGNOSIS — R262 Difficulty in walking, not elsewhere classified: Secondary | ICD-10-CM | POA: Diagnosis not present

## 2019-03-12 DIAGNOSIS — S92902A Unspecified fracture of left foot, initial encounter for closed fracture: Secondary | ICD-10-CM | POA: Diagnosis not present

## 2019-03-14 DIAGNOSIS — S92902A Unspecified fracture of left foot, initial encounter for closed fracture: Secondary | ICD-10-CM | POA: Diagnosis not present

## 2019-03-21 DIAGNOSIS — S99922A Unspecified injury of left foot, initial encounter: Secondary | ICD-10-CM | POA: Diagnosis not present

## 2019-04-08 DIAGNOSIS — M79672 Pain in left foot: Secondary | ICD-10-CM | POA: Diagnosis not present

## 2019-05-16 ENCOUNTER — Other Ambulatory Visit: Payer: Self-pay | Admitting: Family Medicine

## 2019-05-16 DIAGNOSIS — M542 Cervicalgia: Secondary | ICD-10-CM | POA: Diagnosis not present

## 2019-05-16 DIAGNOSIS — M25512 Pain in left shoulder: Secondary | ICD-10-CM | POA: Diagnosis not present

## 2019-05-16 DIAGNOSIS — E785 Hyperlipidemia, unspecified: Secondary | ICD-10-CM

## 2019-05-25 IMAGING — US US EXTREM UP*L* LTD
1 series · 14 of 25 positions shown · non-contrast
Comparison: None.

CLINICAL DATA: Palmar soft tissue swelling of the hand/wrist and
index finger.

EXAM:
ULTRASOUND LEFT UPPER EXTREMITY LIMITED
TECHNIQUE: Ultrasound examination of the upper extremity soft tissues was
performed in the area of clinical concern.

[Series 1: us extrem up*left* ltd · 0.05mm/px · 14 of 37 slices shown]
[im 1/37]
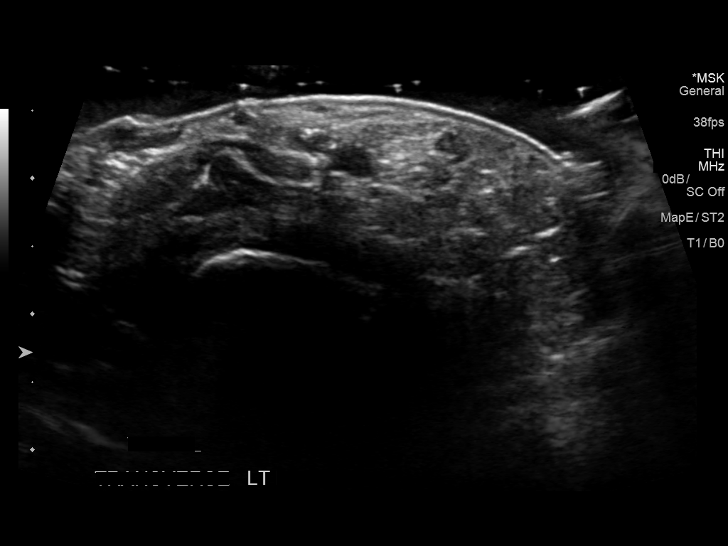
[im 4/37]
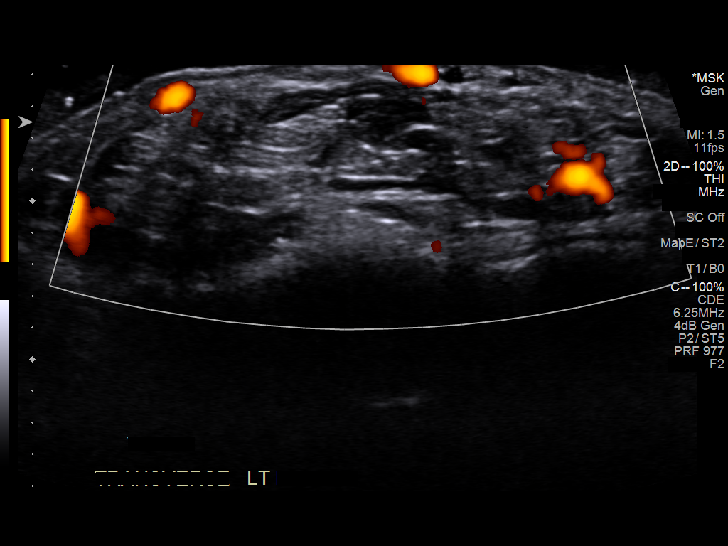
[im 7/37]
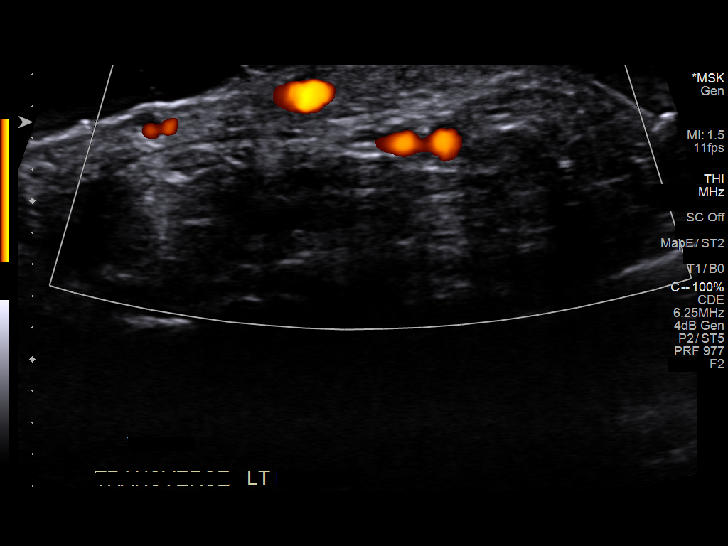
[im 10/37]
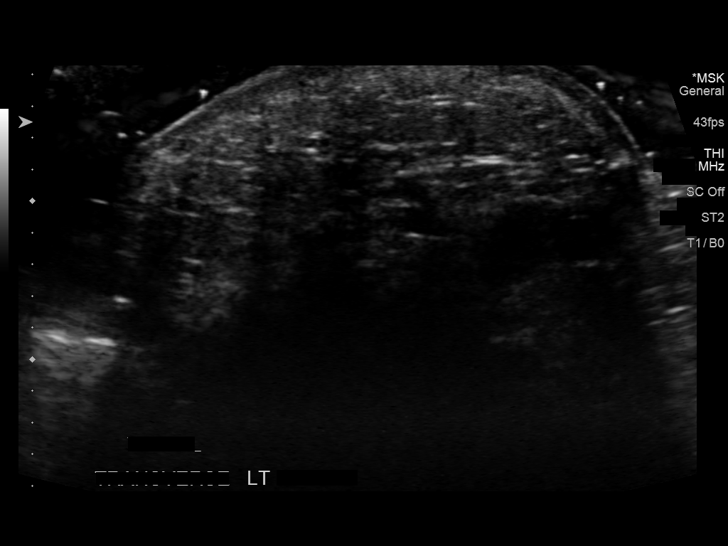
[im 13/37]
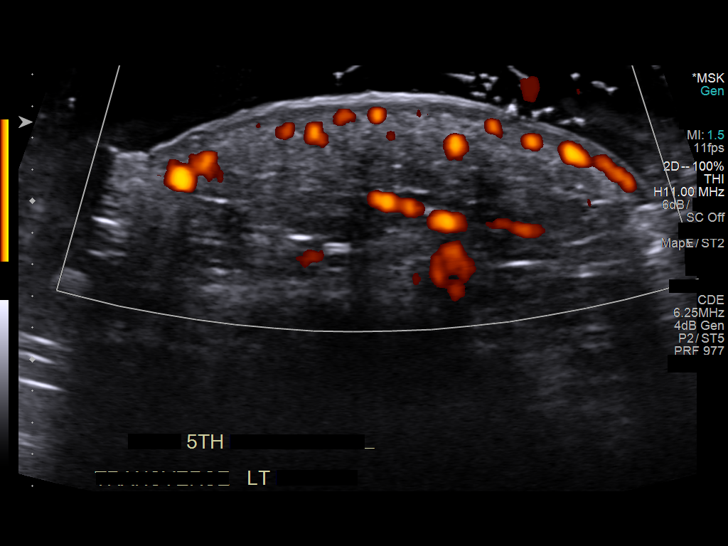
[im 14/37]
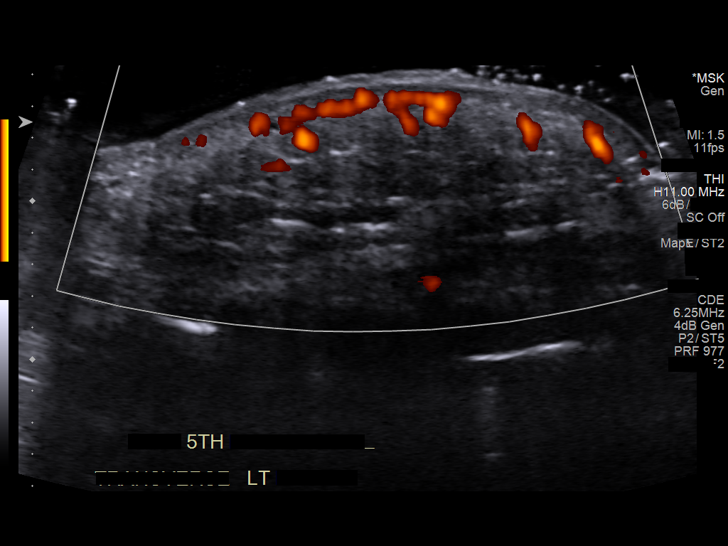
[im 17/37]
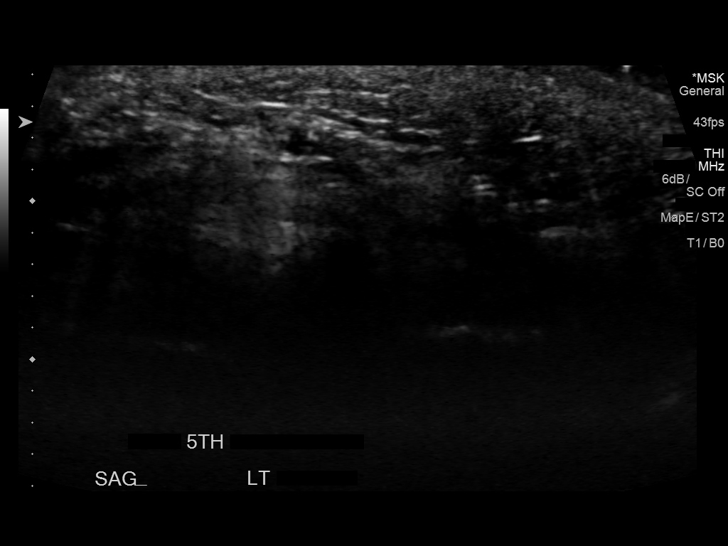
[im 20/37]
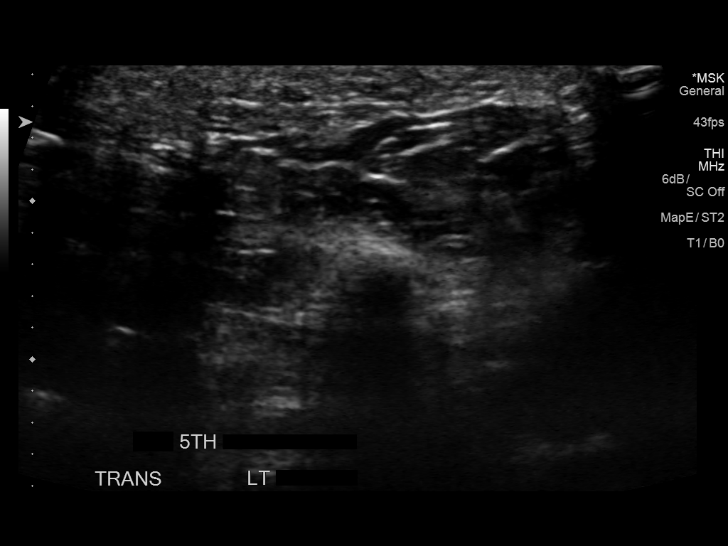
[im 23/37]
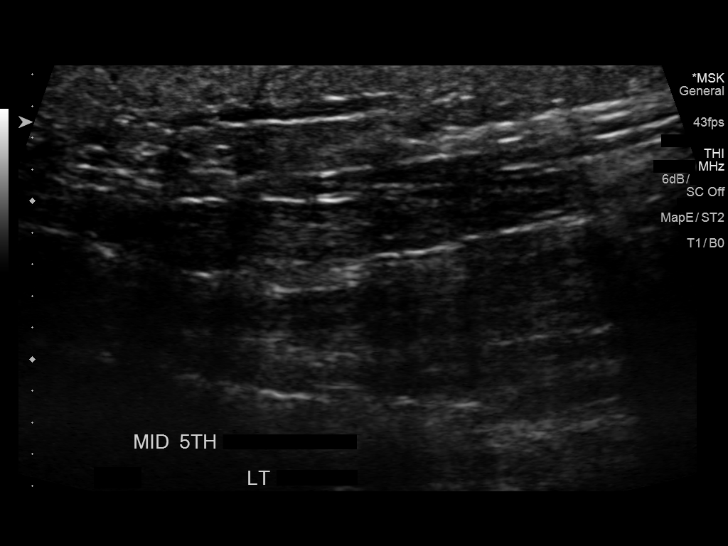
[im 25/37]
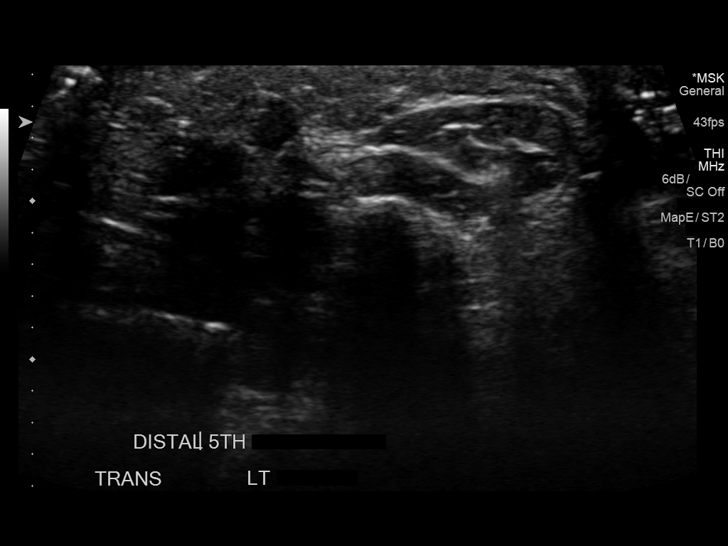
[im 28/37]
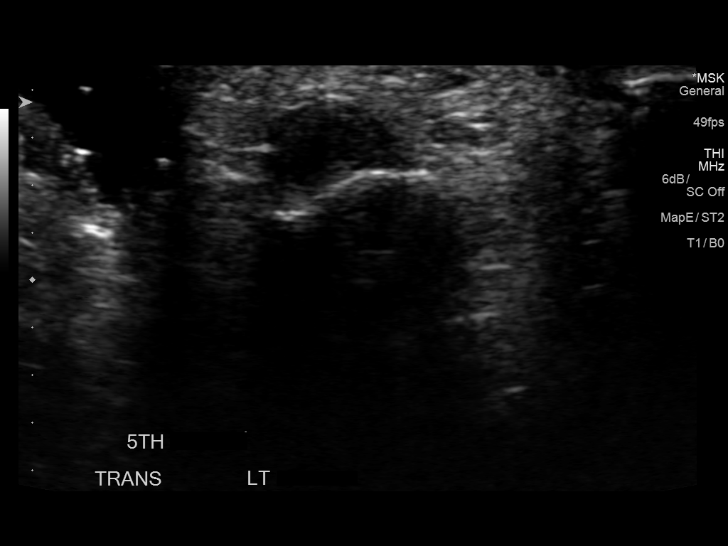
[im 31/37]
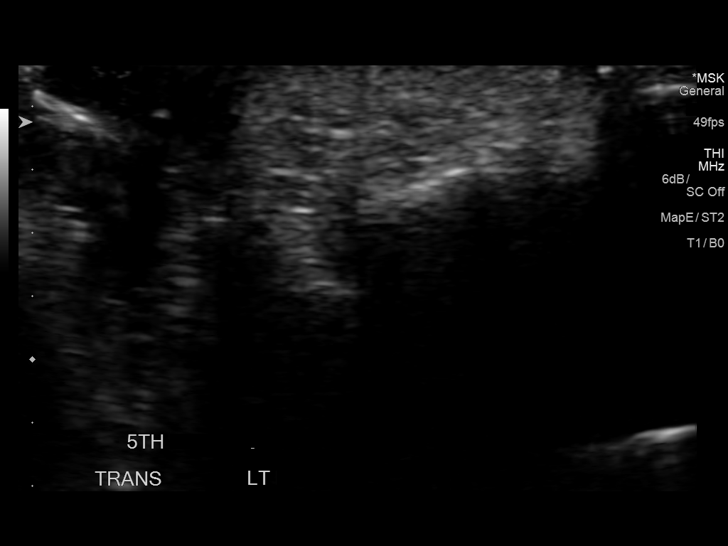
[im 34/37]
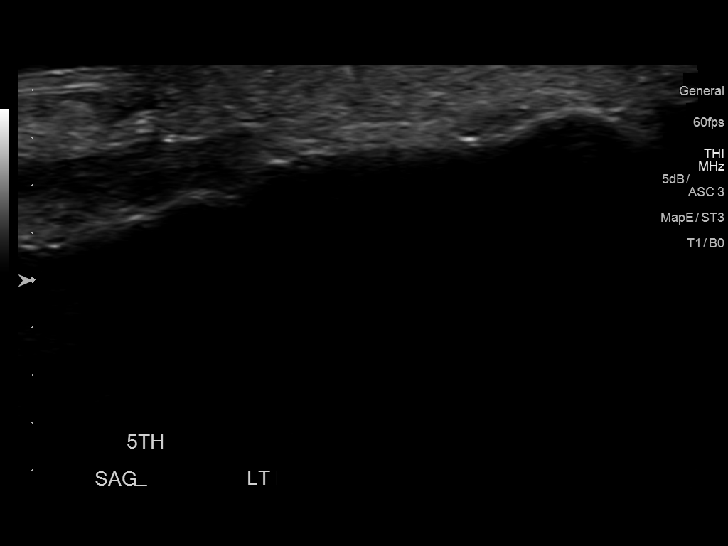
[im 37/37]
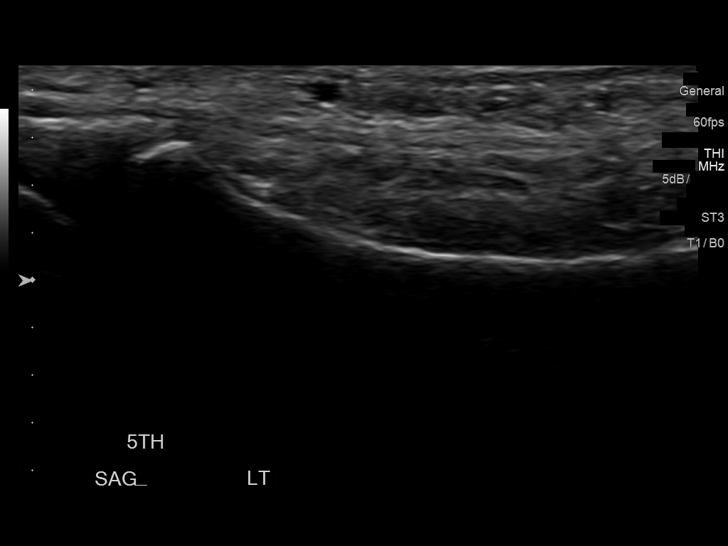

[14 of 25 positions shown; findings below may reference images not displayed]

FINDINGS: Focused ultrasound in the area of clinical concern along the palmar
aspect of the left hand/wrist and index finger demonstrates
scattered mild subcutaneous edema and hyperemia without focal fluid
collection or soft tissue mass.
IMPRESSION: 1. Nonspecific mild subcutaneous edema and hyperemia in the area of
clinical concern. No discrete fluid collection or mass.

## 2019-06-24 DIAGNOSIS — N2889 Other specified disorders of kidney and ureter: Secondary | ICD-10-CM | POA: Diagnosis not present

## 2019-06-24 DIAGNOSIS — R109 Unspecified abdominal pain: Secondary | ICD-10-CM | POA: Diagnosis not present

## 2019-06-24 DIAGNOSIS — K3189 Other diseases of stomach and duodenum: Secondary | ICD-10-CM | POA: Diagnosis not present

## 2019-06-27 DIAGNOSIS — Z20822 Contact with and (suspected) exposure to covid-19: Secondary | ICD-10-CM | POA: Diagnosis not present

## 2019-07-02 DIAGNOSIS — K8689 Other specified diseases of pancreas: Secondary | ICD-10-CM | POA: Diagnosis not present

## 2019-07-02 DIAGNOSIS — K259 Gastric ulcer, unspecified as acute or chronic, without hemorrhage or perforation: Secondary | ICD-10-CM | POA: Diagnosis not present

## 2019-07-02 DIAGNOSIS — K3189 Other diseases of stomach and duodenum: Secondary | ICD-10-CM | POA: Diagnosis not present

## 2019-07-02 DIAGNOSIS — K2950 Unspecified chronic gastritis without bleeding: Secondary | ICD-10-CM | POA: Diagnosis not present

## 2019-07-02 DIAGNOSIS — R933 Abnormal findings on diagnostic imaging of other parts of digestive tract: Secondary | ICD-10-CM | POA: Diagnosis not present

## 2019-07-02 DIAGNOSIS — R1013 Epigastric pain: Secondary | ICD-10-CM | POA: Diagnosis not present

## 2019-07-08 DIAGNOSIS — N2889 Other specified disorders of kidney and ureter: Secondary | ICD-10-CM | POA: Diagnosis not present

## 2019-07-08 DIAGNOSIS — K3189 Other diseases of stomach and duodenum: Secondary | ICD-10-CM | POA: Diagnosis not present

## 2019-07-15 DIAGNOSIS — S29012A Strain of muscle and tendon of back wall of thorax, initial encounter: Secondary | ICD-10-CM | POA: Diagnosis not present

## 2019-07-23 DIAGNOSIS — Z01812 Encounter for preprocedural laboratory examination: Secondary | ICD-10-CM | POA: Diagnosis not present

## 2019-07-23 DIAGNOSIS — Z20822 Contact with and (suspected) exposure to covid-19: Secondary | ICD-10-CM | POA: Diagnosis not present

## 2019-07-29 DIAGNOSIS — E785 Hyperlipidemia, unspecified: Secondary | ICD-10-CM | POA: Diagnosis not present

## 2019-07-29 DIAGNOSIS — K259 Gastric ulcer, unspecified as acute or chronic, without hemorrhage or perforation: Secondary | ICD-10-CM | POA: Diagnosis not present

## 2019-07-29 DIAGNOSIS — K8689 Other specified diseases of pancreas: Secondary | ICD-10-CM | POA: Diagnosis not present

## 2019-07-29 DIAGNOSIS — C884 Extranodal marginal zone B-cell lymphoma of mucosa-associated lymphoid tissue [MALT-lymphoma]: Secondary | ICD-10-CM | POA: Diagnosis not present

## 2019-07-29 DIAGNOSIS — K3189 Other diseases of stomach and duodenum: Secondary | ICD-10-CM | POA: Diagnosis not present

## 2019-07-29 DIAGNOSIS — K257 Chronic gastric ulcer without hemorrhage or perforation: Secondary | ICD-10-CM | POA: Diagnosis not present

## 2019-08-06 DIAGNOSIS — R319 Hematuria, unspecified: Secondary | ICD-10-CM | POA: Diagnosis not present

## 2019-08-06 DIAGNOSIS — Z8572 Personal history of non-Hodgkin lymphomas: Secondary | ICD-10-CM | POA: Diagnosis not present

## 2019-08-15 DIAGNOSIS — R6889 Other general symptoms and signs: Secondary | ICD-10-CM | POA: Diagnosis not present

## 2019-08-15 DIAGNOSIS — C884 Extranodal marginal zone B-cell lymphoma of mucosa-associated lymphoid tissue [MALT-lymphoma]: Secondary | ICD-10-CM | POA: Diagnosis not present

## 2019-08-15 DIAGNOSIS — N2889 Other specified disorders of kidney and ureter: Secondary | ICD-10-CM | POA: Diagnosis not present

## 2019-08-15 DIAGNOSIS — M25512 Pain in left shoulder: Secondary | ICD-10-CM | POA: Diagnosis not present

## 2019-08-27 DIAGNOSIS — C884 Extranodal marginal zone B-cell lymphoma of mucosa-associated lymphoid tissue [MALT-lymphoma]: Secondary | ICD-10-CM | POA: Diagnosis not present

## 2019-08-27 DIAGNOSIS — C8590 Non-Hodgkin lymphoma, unspecified, unspecified site: Secondary | ICD-10-CM | POA: Diagnosis not present

## 2019-09-10 DIAGNOSIS — N2889 Other specified disorders of kidney and ureter: Secondary | ICD-10-CM | POA: Diagnosis not present

## 2019-09-10 DIAGNOSIS — C884 Extranodal marginal zone B-cell lymphoma of mucosa-associated lymphoid tissue [MALT-lymphoma]: Secondary | ICD-10-CM | POA: Diagnosis not present

## 2019-09-20 DIAGNOSIS — N281 Cyst of kidney, acquired: Secondary | ICD-10-CM | POA: Diagnosis not present

## 2019-09-20 DIAGNOSIS — R935 Abnormal findings on diagnostic imaging of other abdominal regions, including retroperitoneum: Secondary | ICD-10-CM | POA: Diagnosis not present

## 2019-09-20 DIAGNOSIS — K7689 Other specified diseases of liver: Secondary | ICD-10-CM | POA: Diagnosis not present

## 2019-09-20 DIAGNOSIS — R931 Abnormal findings on diagnostic imaging of heart and coronary circulation: Secondary | ICD-10-CM | POA: Diagnosis not present

## 2019-09-23 DIAGNOSIS — Z20822 Contact with and (suspected) exposure to covid-19: Secondary | ICD-10-CM | POA: Diagnosis not present

## 2019-09-23 DIAGNOSIS — C884 Extranodal marginal zone B-cell lymphoma of mucosa-associated lymphoid tissue [MALT-lymphoma]: Secondary | ICD-10-CM | POA: Diagnosis not present

## 2019-10-03 DIAGNOSIS — C884 Extranodal marginal zone B-cell lymphoma of mucosa-associated lymphoid tissue [MALT-lymphoma]: Secondary | ICD-10-CM | POA: Diagnosis not present

## 2019-10-03 DIAGNOSIS — Z20822 Contact with and (suspected) exposure to covid-19: Secondary | ICD-10-CM | POA: Diagnosis not present

## 2019-10-03 DIAGNOSIS — F17210 Nicotine dependence, cigarettes, uncomplicated: Secondary | ICD-10-CM | POA: Diagnosis not present

## 2019-10-07 ENCOUNTER — Other Ambulatory Visit: Payer: Self-pay | Admitting: Family Medicine

## 2019-10-07 DIAGNOSIS — Z20822 Contact with and (suspected) exposure to covid-19: Secondary | ICD-10-CM | POA: Diagnosis not present

## 2019-10-07 DIAGNOSIS — C884 Extranodal marginal zone B-cell lymphoma of mucosa-associated lymphoid tissue [MALT-lymphoma]: Secondary | ICD-10-CM | POA: Diagnosis not present

## 2019-10-07 DIAGNOSIS — Z510 Encounter for antineoplastic radiation therapy: Secondary | ICD-10-CM | POA: Diagnosis not present

## 2019-10-07 DIAGNOSIS — E785 Hyperlipidemia, unspecified: Secondary | ICD-10-CM

## 2019-10-08 DIAGNOSIS — C884 Extranodal marginal zone B-cell lymphoma of mucosa-associated lymphoid tissue [MALT-lymphoma]: Secondary | ICD-10-CM | POA: Diagnosis not present

## 2019-10-08 DIAGNOSIS — Z510 Encounter for antineoplastic radiation therapy: Secondary | ICD-10-CM | POA: Diagnosis not present

## 2019-10-08 DIAGNOSIS — Z20822 Contact with and (suspected) exposure to covid-19: Secondary | ICD-10-CM | POA: Diagnosis not present

## 2019-10-13 DIAGNOSIS — Z510 Encounter for antineoplastic radiation therapy: Secondary | ICD-10-CM | POA: Diagnosis not present

## 2019-10-13 DIAGNOSIS — Z20822 Contact with and (suspected) exposure to covid-19: Secondary | ICD-10-CM | POA: Diagnosis not present

## 2019-10-13 DIAGNOSIS — C884 Extranodal marginal zone B-cell lymphoma of mucosa-associated lymphoid tissue [MALT-lymphoma]: Secondary | ICD-10-CM | POA: Diagnosis not present

## 2019-10-14 DIAGNOSIS — C884 Extranodal marginal zone B-cell lymphoma of mucosa-associated lymphoid tissue [MALT-lymphoma]: Secondary | ICD-10-CM | POA: Diagnosis not present

## 2019-10-14 DIAGNOSIS — Z20822 Contact with and (suspected) exposure to covid-19: Secondary | ICD-10-CM | POA: Diagnosis not present

## 2019-10-14 DIAGNOSIS — Z510 Encounter for antineoplastic radiation therapy: Secondary | ICD-10-CM | POA: Diagnosis not present

## 2019-10-15 DIAGNOSIS — Z20822 Contact with and (suspected) exposure to covid-19: Secondary | ICD-10-CM | POA: Diagnosis not present

## 2019-10-15 DIAGNOSIS — Z510 Encounter for antineoplastic radiation therapy: Secondary | ICD-10-CM | POA: Diagnosis not present

## 2019-10-15 DIAGNOSIS — C884 Extranodal marginal zone B-cell lymphoma of mucosa-associated lymphoid tissue [MALT-lymphoma]: Secondary | ICD-10-CM | POA: Diagnosis not present

## 2019-10-16 DIAGNOSIS — Z510 Encounter for antineoplastic radiation therapy: Secondary | ICD-10-CM | POA: Diagnosis not present

## 2019-10-16 DIAGNOSIS — Z20822 Contact with and (suspected) exposure to covid-19: Secondary | ICD-10-CM | POA: Diagnosis not present

## 2019-10-16 DIAGNOSIS — C884 Extranodal marginal zone B-cell lymphoma of mucosa-associated lymphoid tissue [MALT-lymphoma]: Secondary | ICD-10-CM | POA: Diagnosis not present

## 2019-10-17 DIAGNOSIS — Z510 Encounter for antineoplastic radiation therapy: Secondary | ICD-10-CM | POA: Diagnosis not present

## 2019-10-17 DIAGNOSIS — Z20822 Contact with and (suspected) exposure to covid-19: Secondary | ICD-10-CM | POA: Diagnosis not present

## 2019-10-17 DIAGNOSIS — C884 Extranodal marginal zone B-cell lymphoma of mucosa-associated lymphoid tissue [MALT-lymphoma]: Secondary | ICD-10-CM | POA: Diagnosis not present

## 2019-10-20 DIAGNOSIS — Z20822 Contact with and (suspected) exposure to covid-19: Secondary | ICD-10-CM | POA: Diagnosis not present

## 2019-10-20 DIAGNOSIS — C884 Extranodal marginal zone B-cell lymphoma of mucosa-associated lymphoid tissue [MALT-lymphoma]: Secondary | ICD-10-CM | POA: Diagnosis not present

## 2019-10-20 DIAGNOSIS — Z510 Encounter for antineoplastic radiation therapy: Secondary | ICD-10-CM | POA: Diagnosis not present

## 2019-10-21 DIAGNOSIS — C884 Extranodal marginal zone B-cell lymphoma of mucosa-associated lymphoid tissue [MALT-lymphoma]: Secondary | ICD-10-CM | POA: Diagnosis not present

## 2019-10-21 DIAGNOSIS — Z20822 Contact with and (suspected) exposure to covid-19: Secondary | ICD-10-CM | POA: Diagnosis not present

## 2019-10-21 DIAGNOSIS — Z510 Encounter for antineoplastic radiation therapy: Secondary | ICD-10-CM | POA: Diagnosis not present

## 2019-10-22 DIAGNOSIS — C884 Extranodal marginal zone B-cell lymphoma of mucosa-associated lymphoid tissue [MALT-lymphoma]: Secondary | ICD-10-CM | POA: Diagnosis not present

## 2019-10-22 DIAGNOSIS — Z510 Encounter for antineoplastic radiation therapy: Secondary | ICD-10-CM | POA: Diagnosis not present

## 2019-10-22 DIAGNOSIS — Z20822 Contact with and (suspected) exposure to covid-19: Secondary | ICD-10-CM | POA: Diagnosis not present

## 2019-10-23 DIAGNOSIS — Z510 Encounter for antineoplastic radiation therapy: Secondary | ICD-10-CM | POA: Diagnosis not present

## 2019-10-23 DIAGNOSIS — Z20822 Contact with and (suspected) exposure to covid-19: Secondary | ICD-10-CM | POA: Diagnosis not present

## 2019-10-23 DIAGNOSIS — C884 Extranodal marginal zone B-cell lymphoma of mucosa-associated lymphoid tissue [MALT-lymphoma]: Secondary | ICD-10-CM | POA: Diagnosis not present

## 2019-10-23 DIAGNOSIS — Z1231 Encounter for screening mammogram for malignant neoplasm of breast: Secondary | ICD-10-CM | POA: Diagnosis not present

## 2019-10-24 DIAGNOSIS — C884 Extranodal marginal zone B-cell lymphoma of mucosa-associated lymphoid tissue [MALT-lymphoma]: Secondary | ICD-10-CM | POA: Diagnosis not present

## 2019-10-24 DIAGNOSIS — Z510 Encounter for antineoplastic radiation therapy: Secondary | ICD-10-CM | POA: Diagnosis not present

## 2019-10-24 DIAGNOSIS — Z20822 Contact with and (suspected) exposure to covid-19: Secondary | ICD-10-CM | POA: Diagnosis not present

## 2019-10-27 DIAGNOSIS — C884 Extranodal marginal zone B-cell lymphoma of mucosa-associated lymphoid tissue [MALT-lymphoma]: Secondary | ICD-10-CM | POA: Diagnosis not present

## 2019-10-27 DIAGNOSIS — Z20822 Contact with and (suspected) exposure to covid-19: Secondary | ICD-10-CM | POA: Diagnosis not present

## 2019-10-27 DIAGNOSIS — Z510 Encounter for antineoplastic radiation therapy: Secondary | ICD-10-CM | POA: Diagnosis not present

## 2019-10-28 DIAGNOSIS — C884 Extranodal marginal zone B-cell lymphoma of mucosa-associated lymphoid tissue [MALT-lymphoma]: Secondary | ICD-10-CM | POA: Diagnosis not present

## 2019-10-28 DIAGNOSIS — Z510 Encounter for antineoplastic radiation therapy: Secondary | ICD-10-CM | POA: Diagnosis not present

## 2019-10-29 DIAGNOSIS — C884 Extranodal marginal zone B-cell lymphoma of mucosa-associated lymphoid tissue [MALT-lymphoma]: Secondary | ICD-10-CM | POA: Diagnosis not present

## 2019-10-29 DIAGNOSIS — Z510 Encounter for antineoplastic radiation therapy: Secondary | ICD-10-CM | POA: Diagnosis not present

## 2019-10-29 DIAGNOSIS — Z20822 Contact with and (suspected) exposure to covid-19: Secondary | ICD-10-CM | POA: Diagnosis not present

## 2019-10-30 DIAGNOSIS — Z510 Encounter for antineoplastic radiation therapy: Secondary | ICD-10-CM | POA: Diagnosis not present

## 2019-10-30 DIAGNOSIS — Z20822 Contact with and (suspected) exposure to covid-19: Secondary | ICD-10-CM | POA: Diagnosis not present

## 2019-10-30 DIAGNOSIS — C884 Extranodal marginal zone B-cell lymphoma of mucosa-associated lymphoid tissue [MALT-lymphoma]: Secondary | ICD-10-CM | POA: Diagnosis not present

## 2019-10-31 DIAGNOSIS — Z510 Encounter for antineoplastic radiation therapy: Secondary | ICD-10-CM | POA: Diagnosis not present

## 2019-10-31 DIAGNOSIS — C884 Extranodal marginal zone B-cell lymphoma of mucosa-associated lymphoid tissue [MALT-lymphoma]: Secondary | ICD-10-CM | POA: Diagnosis not present

## 2019-10-31 DIAGNOSIS — Z20822 Contact with and (suspected) exposure to covid-19: Secondary | ICD-10-CM | POA: Diagnosis not present

## 2019-11-04 DIAGNOSIS — Z510 Encounter for antineoplastic radiation therapy: Secondary | ICD-10-CM | POA: Diagnosis not present

## 2019-11-04 DIAGNOSIS — C884 Extranodal marginal zone B-cell lymphoma of mucosa-associated lymphoid tissue [MALT-lymphoma]: Secondary | ICD-10-CM | POA: Diagnosis not present

## 2019-11-05 DIAGNOSIS — Z510 Encounter for antineoplastic radiation therapy: Secondary | ICD-10-CM | POA: Diagnosis not present

## 2019-11-05 DIAGNOSIS — C884 Extranodal marginal zone B-cell lymphoma of mucosa-associated lymphoid tissue [MALT-lymphoma]: Secondary | ICD-10-CM | POA: Diagnosis not present

## 2019-11-06 DIAGNOSIS — Z510 Encounter for antineoplastic radiation therapy: Secondary | ICD-10-CM | POA: Diagnosis not present

## 2019-11-06 DIAGNOSIS — C884 Extranodal marginal zone B-cell lymphoma of mucosa-associated lymphoid tissue [MALT-lymphoma]: Secondary | ICD-10-CM | POA: Diagnosis not present

## 2019-11-07 DIAGNOSIS — Z510 Encounter for antineoplastic radiation therapy: Secondary | ICD-10-CM | POA: Diagnosis not present

## 2019-11-07 DIAGNOSIS — R928 Other abnormal and inconclusive findings on diagnostic imaging of breast: Secondary | ICD-10-CM | POA: Diagnosis not present

## 2019-11-07 DIAGNOSIS — C884 Extranodal marginal zone B-cell lymphoma of mucosa-associated lymphoid tissue [MALT-lymphoma]: Secondary | ICD-10-CM | POA: Diagnosis not present

## 2019-11-10 DIAGNOSIS — Z510 Encounter for antineoplastic radiation therapy: Secondary | ICD-10-CM | POA: Diagnosis not present

## 2019-11-10 DIAGNOSIS — C884 Extranodal marginal zone B-cell lymphoma of mucosa-associated lymphoid tissue [MALT-lymphoma]: Secondary | ICD-10-CM | POA: Diagnosis not present

## 2019-11-11 DIAGNOSIS — C884 Extranodal marginal zone B-cell lymphoma of mucosa-associated lymphoid tissue [MALT-lymphoma]: Secondary | ICD-10-CM | POA: Diagnosis not present

## 2019-11-11 DIAGNOSIS — Z510 Encounter for antineoplastic radiation therapy: Secondary | ICD-10-CM | POA: Diagnosis not present

## 2019-12-24 DIAGNOSIS — C8580 Other specified types of non-Hodgkin lymphoma, unspecified site: Secondary | ICD-10-CM | POA: Diagnosis not present

## 2020-01-01 DIAGNOSIS — R109 Unspecified abdominal pain: Secondary | ICD-10-CM | POA: Diagnosis not present

## 2020-01-01 DIAGNOSIS — Z13820 Encounter for screening for osteoporosis: Secondary | ICD-10-CM | POA: Diagnosis not present

## 2020-04-15 ENCOUNTER — Other Ambulatory Visit: Payer: Self-pay | Admitting: Family Medicine

## 2020-04-15 DIAGNOSIS — E785 Hyperlipidemia, unspecified: Secondary | ICD-10-CM

## 2020-04-15 NOTE — Telephone Encounter (Signed)
Spoke with pt state that she has a new PCP in [place]. Pt advised to notify her pharmacy that she no longer see Dr Volanda Napoleon

## 2020-08-01 ENCOUNTER — Other Ambulatory Visit: Payer: Self-pay | Admitting: Family Medicine

## 2020-08-01 DIAGNOSIS — E785 Hyperlipidemia, unspecified: Secondary | ICD-10-CM

## 2020-11-05 ENCOUNTER — Other Ambulatory Visit: Payer: Self-pay | Admitting: Family Medicine

## 2020-11-05 DIAGNOSIS — E785 Hyperlipidemia, unspecified: Secondary | ICD-10-CM
